# Patient Record
Sex: Male | Born: 1945 | Race: White | Hispanic: No | Marital: Married | Smoking: Never smoker
Health system: Southern US, Community
[De-identification: ages and names within clinical notes are randomized; demographics above are authoritative.]

## PROBLEM LIST (undated history)

## (undated) DIAGNOSIS — M1990 Unspecified osteoarthritis, unspecified site: Secondary | ICD-10-CM

## (undated) DIAGNOSIS — R7303 Prediabetes: Secondary | ICD-10-CM

## (undated) DIAGNOSIS — Z87442 Personal history of urinary calculi: Secondary | ICD-10-CM

## (undated) DIAGNOSIS — K219 Gastro-esophageal reflux disease without esophagitis: Secondary | ICD-10-CM

## (undated) DIAGNOSIS — G4730 Sleep apnea, unspecified: Secondary | ICD-10-CM

## (undated) DIAGNOSIS — I10 Essential (primary) hypertension: Secondary | ICD-10-CM

## (undated) DIAGNOSIS — J40 Bronchitis, not specified as acute or chronic: Secondary | ICD-10-CM

## (undated) DIAGNOSIS — J189 Pneumonia, unspecified organism: Secondary | ICD-10-CM

## (undated) DIAGNOSIS — F32A Depression, unspecified: Secondary | ICD-10-CM

## (undated) DIAGNOSIS — F419 Anxiety disorder, unspecified: Secondary | ICD-10-CM

## (undated) DIAGNOSIS — R0600 Dyspnea, unspecified: Secondary | ICD-10-CM

## (undated) DIAGNOSIS — I739 Peripheral vascular disease, unspecified: Secondary | ICD-10-CM

## (undated) HISTORY — PX: LEG SURGERY: SHX1003

## (undated) HISTORY — PX: HERNIA REPAIR: SHX51

## (undated) HISTORY — PX: TONSILLECTOMY: SUR1361

## (undated) HISTORY — PX: PILONIDAL CYST EXCISION: SHX744

## (undated) HISTORY — PX: EYE SURGERY: SHX253

## (undated) HISTORY — PX: CARDIAC CATHETERIZATION: SHX172

---

## 2019-12-09 ENCOUNTER — Other Ambulatory Visit: Payer: Self-pay | Admitting: Neurosurgery

## 2019-12-09 DIAGNOSIS — M48062 Spinal stenosis, lumbar region with neurogenic claudication: Secondary | ICD-10-CM

## 2019-12-21 ENCOUNTER — Other Ambulatory Visit: Payer: Self-pay

## 2019-12-28 ENCOUNTER — Ambulatory Visit
Admission: RE | Admit: 2019-12-28 | Discharge: 2019-12-28 | Disposition: A | Discharge status: Home / Self Care | Payer: Medicare HMO | Source: Ambulatory Visit | Attending: Neurosurgery | Admitting: Neurosurgery

## 2019-12-28 ENCOUNTER — Other Ambulatory Visit: Payer: Self-pay

## 2019-12-28 DIAGNOSIS — M48062 Spinal stenosis, lumbar region with neurogenic claudication: Secondary | ICD-10-CM

## 2019-12-31 ENCOUNTER — Other Ambulatory Visit: Payer: Self-pay | Admitting: Neurosurgery

## 2020-01-07 NOTE — Pre-Procedure Instructions (Signed)
[facility] - [place] - [id number] [place] [address] Phone: [phone] Fax: [phone]      Your procedure is scheduled on Wednesday, February 24th.  Report to [facility] Main Entrance "A" at 10:50 A.M., and check in at the Admitting office.  Call this number if you have problems the morning of surgery:  [phone]  Call [phone] if you have any questions prior to your surgery date Monday-Friday 8am-4pm    Remember:  Do not eat or drink after midnight the night before your surgery    Take these medicines the morning of surgery with A SIP OF WATER  buPROPion (WELLBUTRIN SR) gabapentin (NEURONTIN) tetrahydrozoline   Follow your surgeon's instructions on when to stop Aspirin.  If no instructions were given by your surgeon then you will need to call the office to get those instructions.    As of today, STOP taking any Aspirin (unless otherwise instructed by your surgeon), Aleve, Naproxen, Ibuprofen, Motrin, Advil, Goody's, BC's, all herbal medications, fish oil, and all vitamins.    The Morning of Surgery  Do not wear jewelry.  Do not wear lotions, powders, or colognes, or deodorant  Men may shave face and neck.  Do not bring valuables to the hospital.  [facility] is not responsible for any belongings or valuables.  If you are a smoker, DO NOT Smoke 24 hours prior to surgery  If you wear a CPAP at night please bring your mask the morning of surgery   Remember that you must have someone to transport you home after your surgery, and remain with you for 24 hours if you are discharged the same day.   Please bring cases for contacts, glasses, hearing aids, dentures or bridgework because it cannot be worn into surgery.    Leave your suitcase in the car.  After surgery it may be brought to your room.  For patients admitted to the hospital, discharge time will be determined by your treatment team.  Patients discharged the  day of surgery will not be allowed to drive home.    Special instructions:   [place]- Preparing For Surgery  Before surgery, you can play an important role. Because skin is not sterile, your skin needs to be as free of germs as possible. You can reduce the number of germs on your skin by washing with CHG (chlorahexidine gluconate) Soap before surgery.  CHG is an antiseptic cleaner which kills germs and bonds with the skin to continue killing germs even after washing.    Oral Hygiene is also important to reduce your risk of infection.  Remember - BRUSH YOUR TEETH THE MORNING OF SURGERY WITH YOUR REGULAR TOOTHPASTE  Please do not use if you have an allergy to CHG or antibacterial soaps. If your skin becomes reddened/irritated stop using the CHG.  Do not shave (including legs and underarms) for at least 48 hours prior to first CHG shower. It is OK to shave your face.  Please follow these instructions carefully.   1. Shower the NIGHT BEFORE SURGERY and the MORNING OF SURGERY with CHG Soap.   2. If you chose to wash your hair, wash your hair first as usual with your normal shampoo.  3. After you shampoo, rinse your hair and body thoroughly to remove the shampoo.  4. Use CHG as you would any other liquid soap. You can apply CHG directly to the skin and wash gently  with a scrungie or a clean washcloth.   5. Apply the CHG Soap to your body ONLY FROM THE NECK DOWN.  Do not use on open wounds or open sores. Avoid contact with your eyes, ears, mouth and genitals (private parts). Wash Face and genitals (private parts)  with your normal soap.   6. Wash thoroughly, paying special attention to the area where your surgery will be performed.  7. Thoroughly rinse your body with warm water from the neck down.  8. DO NOT shower/wash with your normal soap after using and rinsing off the CHG Soap.  9. Pat yourself dry with a CLEAN TOWEL.  10. Wear CLEAN PAJAMAS to bed the night before surgery, wear  comfortable clothes the morning of surgery  11. Place CLEAN SHEETS on your bed the night of your first shower and DO NOT SLEEP WITH PETS.    Day of Surgery:  Please shower the morning of surgery with the CHG soap Do not apply any deodorants/lotions. Please wear clean clothes to the hospital/surgery center.   Remember to brush your teeth WITH YOUR REGULAR TOOTHPASTE.   Please read over the following fact sheets that you were given.

## 2020-01-10 ENCOUNTER — Other Ambulatory Visit (HOSPITAL_COMMUNITY)
Admission: RE | Admit: 2020-01-10 | Discharge: 2020-01-10 | Disposition: A | Discharge status: Home / Self Care | Payer: Medicare HMO | Source: Ambulatory Visit | Attending: Neurosurgery | Admitting: Neurosurgery

## 2020-01-10 ENCOUNTER — Encounter (HOSPITAL_COMMUNITY): Payer: Self-pay

## 2020-01-10 ENCOUNTER — Encounter (HOSPITAL_COMMUNITY)
Admission: RE | Admit: 2020-01-10 | Discharge: 2020-01-10 | Disposition: A | Discharge status: Home / Self Care | Payer: Medicare HMO | Source: Ambulatory Visit | Attending: Neurosurgery | Admitting: Neurosurgery

## 2020-01-10 ENCOUNTER — Other Ambulatory Visit: Payer: Self-pay

## 2020-01-10 HISTORY — DX: Essential (primary) hypertension: I10

## 2020-01-10 HISTORY — DX: Bronchitis, not specified as acute or chronic: J40

## 2020-01-10 HISTORY — DX: Personal history of urinary calculi: Z87.442

## 2020-01-10 HISTORY — DX: Sleep apnea, unspecified: G47.30

## 2020-01-10 HISTORY — DX: Gastro-esophageal reflux disease without esophagitis: K21.9

## 2020-01-10 HISTORY — DX: Anxiety disorder, unspecified: F41.9

## 2020-01-10 LAB — BASIC METABOLIC PANEL
Anion gap: 14 (ref 5–15)
BUN: 15 mg/dL (ref 8–23)
CO2: 24 mmol/L (ref 22–32)
Calcium: 9.4 mg/dL (ref 8.9–10.3)
Chloride: 103 mmol/L (ref 98–111)
Creatinine, Ser: 1.09 mg/dL (ref 0.61–1.24)
GFR calc Af Amer: >60 mL/min (ref >60)
GFR calc non Af Amer: >60 mL/min (ref >60)
Glucose, Bld: 127 mg/dL — ABNORMAL HIGH (ref 70–99)
Potassium: 4.1 mmol/L (ref 3.5–5.1)
Sodium: 141 mmol/L (ref 135–145)

## 2020-01-10 LAB — CBC
HCT: 44.1 % (ref 39.0–52.0)
Hemoglobin: 14.3 g/dL (ref 13.0–17.0)
MCH: 31.2 pg (ref 26.0–34.0)
MCHC: 32.4 g/dL (ref 30.0–36.0)
MCV: 96.3 fL (ref 80.0–100.0)
Platelets: 234 10*3/uL (ref 150–400)
RBC: 4.58 MIL/uL (ref 4.22–5.81)
RDW: 12.7 % (ref 11.5–15.5)
WBC: 6 10*3/uL (ref 4.0–10.5)
nRBC: 0 % (ref 0.0–0.2)

## 2020-01-10 LAB — SURGICAL PCR SCREEN
MRSA, PCR: NEGATIVE
Staphylococcus aureus: NEGATIVE

## 2020-01-10 LAB — SARS CORONAVIRUS 2 (TAT 6-24 HRS): SARS Coronavirus 2: NEGATIVE

## 2020-01-10 NOTE — Progress Notes (Signed)
PCP - Dr. Glean Hess  Cardiologist - Denies  Chest x-ray - Denies  EKG - 01/10/20  Stress Test - 2010 -Neg  ECHO - 2010- Neg  Cardiac Cath - 2010- Neg  AICD-na PM-na LOOP-na  Sleep Study - Yes- Positive CPAP - Yes- will bring machine and equipment dos  LABS- 01/10/20: CBC, BMP, PCR, COVID  ASA- LD- 1 wk ago  ERAS- No  HA1C- Denies  Anesthesia- No  Pt denies having chest pain, sob, or fever at this time. All instructions explained to the pt, with a verbal understanding of the material. Pt agrees to go over the instructions while at home for a better understanding. Pt also instructed to self quarantine after being tested for COVID-19. The opportunity to ask questions was provided.   Coronavirus Screening  Have you experienced the following symptoms:  Cough yes/no: No Fever (>100.26F)  yes/no: No Runny nose yes/no: No Sore throat yes/no: No Difficulty breathing/shortness of breath  yes/no: No  Have you or a family member traveled in the last 14 days and where? yes/no: No   If the patient indicates "YES" to the above questions, their PAT will be rescheduled to limit the exposure to others and, the surgeon will be notified. THE PATIENT WILL NEED TO BE ASYMPTOMATIC FOR 14 DAYS.   If the patient is not experiencing any of these symptoms, the PAT nurse will instruct them to NOT bring anyone with them to their appointment since they may have these symptoms or traveled as well.   Please remind your patients and families that hospital visitation restrictions are in effect and the importance of the restrictions.

## 2020-01-11 MED ORDER — DEXTROSE 5 % IV SOLN
3.0000 g | INTRAVENOUS | Status: AC
  Administered 2020-01-12: 15:00:00 3 g via INTRAVENOUS
  Filled 2020-01-11: qty 3
  Filled 2020-01-11: qty 3000

## 2020-01-12 ENCOUNTER — Inpatient Hospital Stay (HOSPITAL_COMMUNITY): Payer: Medicare HMO | Admitting: Anesthesiology

## 2020-01-12 ENCOUNTER — Inpatient Hospital Stay (HOSPITAL_COMMUNITY): Payer: Medicare HMO

## 2020-01-12 ENCOUNTER — Encounter (HOSPITAL_COMMUNITY)
Admission: RE | Disposition: A | Discharge status: Home / Self Care | Payer: Self-pay | Source: Home / Self Care | Attending: Neurosurgery

## 2020-01-12 ENCOUNTER — Encounter (HOSPITAL_COMMUNITY): Payer: Self-pay | Admitting: Neurosurgery

## 2020-01-12 ENCOUNTER — Other Ambulatory Visit: Payer: Self-pay

## 2020-01-12 ENCOUNTER — Inpatient Hospital Stay (HOSPITAL_COMMUNITY)
Admission: RE | Admit: 2020-01-12 | Discharge: 2020-01-14 | DRG: 520 | Disposition: A | Discharge status: Home / Self Care | Payer: Medicare HMO | Attending: Neurosurgery | Admitting: Neurosurgery

## 2020-01-12 DIAGNOSIS — M48062 Spinal stenosis, lumbar region with neurogenic claudication: Secondary | ICD-10-CM | POA: Diagnosis present

## 2020-01-12 DIAGNOSIS — G4730 Sleep apnea, unspecified: Secondary | ICD-10-CM | POA: Diagnosis present

## 2020-01-12 DIAGNOSIS — I10 Essential (primary) hypertension: Secondary | ICD-10-CM | POA: Diagnosis present

## 2020-01-12 DIAGNOSIS — Z885 Allergy status to narcotic agent status: Secondary | ICD-10-CM

## 2020-01-12 DIAGNOSIS — K219 Gastro-esophageal reflux disease without esophagitis: Secondary | ICD-10-CM | POA: Diagnosis present

## 2020-01-12 DIAGNOSIS — M549 Dorsalgia, unspecified: Secondary | ICD-10-CM | POA: Diagnosis present

## 2020-01-12 DIAGNOSIS — Z6839 Body mass index (BMI) 39.0-39.9, adult: Secondary | ICD-10-CM

## 2020-01-12 DIAGNOSIS — Z20822 Contact with and (suspected) exposure to covid-19: Secondary | ICD-10-CM | POA: Diagnosis present

## 2020-01-12 DIAGNOSIS — M48061 Spinal stenosis, lumbar region without neurogenic claudication: Secondary | ICD-10-CM | POA: Diagnosis present

## 2020-01-12 DIAGNOSIS — Z419 Encounter for procedure for purposes other than remedying health state, unspecified: Secondary | ICD-10-CM

## 2020-01-12 DIAGNOSIS — Z7982 Long term (current) use of aspirin: Secondary | ICD-10-CM | POA: Diagnosis not present

## 2020-01-12 HISTORY — PX: LUMBAR LAMINECTOMY/DECOMPRESSION MICRODISCECTOMY: SHX5026

## 2020-01-12 SURGERY — LUMBAR LAMINECTOMY/DECOMPRESSION MICRODISCECTOMY 3 LEVELS
Anesthesia: General | Site: Spine Lumbar

## 2020-01-12 MED ORDER — LIDOCAINE 2% (20 MG/ML) 5 ML SYRINGE
INTRAMUSCULAR | Status: AC
  Filled 2020-01-12: qty 10

## 2020-01-12 MED ORDER — IBUPROFEN 200 MG PO TABS
400.0000 mg | ORAL_TABLET | Freq: Every day | ORAL | Status: DC
  Administered 2020-01-12 – 2020-01-13 (×2): 400 mg via ORAL
  Filled 2020-01-12 (×2): qty 2

## 2020-01-12 MED ORDER — ACETAMINOPHEN 500 MG PO TABS
1000.0000 mg | ORAL_TABLET | Freq: Once | ORAL | Status: AC
  Administered 2020-01-12: 1000 mg via ORAL
  Filled 2020-01-12: qty 2

## 2020-01-12 MED ORDER — ONDANSETRON HCL 4 MG/2ML IJ SOLN
INTRAMUSCULAR | Status: AC
  Filled 2020-01-12: qty 2

## 2020-01-12 MED ORDER — BUPIVACAINE HCL (PF) 0.25 % IJ SOLN
INTRAMUSCULAR | Status: DC | PRN
  Administered 2020-01-12: 10 mL

## 2020-01-12 MED ORDER — ONDANSETRON HCL 4 MG PO TABS: 4.0000 mg | ORAL_TABLET | Freq: Four times a day (QID) | ORAL | Status: DC | PRN

## 2020-01-12 MED ORDER — ASPIRIN EC 81 MG PO TBEC
81.0000 mg | DELAYED_RELEASE_TABLET | Freq: Every day | ORAL | Status: DC
  Administered 2020-01-13 – 2020-01-14 (×2): 81 mg via ORAL
  Filled 2020-01-12 (×2): qty 1

## 2020-01-12 MED ORDER — ROCURONIUM BROMIDE 10 MG/ML (PF) SYRINGE
PREFILLED_SYRINGE | INTRAVENOUS | Status: DC | PRN
  Administered 2020-01-12 (×2): 50 mg via INTRAVENOUS

## 2020-01-12 MED ORDER — CHLORHEXIDINE GLUCONATE CLOTH 2 % EX PADS: 6.0000 | MEDICATED_PAD | Freq: Once | CUTANEOUS | Status: DC

## 2020-01-12 MED ORDER — PHENYLEPHRINE 40 MCG/ML (10ML) SYRINGE FOR IV PUSH (FOR BLOOD PRESSURE SUPPORT)
PREFILLED_SYRINGE | INTRAVENOUS | Status: AC
  Filled 2020-01-12: qty 20

## 2020-01-12 MED ORDER — TEMAZEPAM 15 MG PO CAPS
15.0000 mg | ORAL_CAPSULE | Freq: Every day | ORAL | Status: DC
  Administered 2020-01-12 – 2020-01-13 (×2): 15 mg via ORAL
  Filled 2020-01-12 (×2): qty 1

## 2020-01-12 MED ORDER — ONDANSETRON HCL 4 MG/2ML IJ SOLN
INTRAMUSCULAR | Status: DC | PRN
  Administered 2020-01-12: 4 mg via INTRAVENOUS

## 2020-01-12 MED ORDER — NAPHAZOLINE-GLYCERIN 0.012-0.2 % OP SOLN
2.0000 [drp] | Freq: Four times a day (QID) | OPHTHALMIC | Status: DC | PRN
  Filled 2020-01-12: qty 15

## 2020-01-12 MED ORDER — B COMPLEX-C PO TABS
1.0000 | ORAL_TABLET | Freq: Every day | ORAL | Status: DC
  Administered 2020-01-13 – 2020-01-14 (×2): 1 via ORAL
  Filled 2020-01-12 (×2): qty 1

## 2020-01-12 MED ORDER — ONDANSETRON HCL 4 MG/2ML IJ SOLN
INTRAMUSCULAR | Status: AC
  Administered 2020-01-12: 4 mg via INTRAVENOUS
  Filled 2020-01-12: qty 2

## 2020-01-12 MED ORDER — ROCURONIUM BROMIDE 10 MG/ML (PF) SYRINGE
PREFILLED_SYRINGE | INTRAVENOUS | Status: AC
  Filled 2020-01-12: qty 10

## 2020-01-12 MED ORDER — FENTANYL CITRATE (PF) 250 MCG/5ML IJ SOLN
INTRAMUSCULAR | Status: AC
  Filled 2020-01-12: qty 5

## 2020-01-12 MED ORDER — SODIUM CHLORIDE 0.9 % IV SOLN: 250.0000 mL | INTRAVENOUS | Status: DC

## 2020-01-12 MED ORDER — SODIUM CHLORIDE 0.9 % IV SOLN: INTRAVENOUS | Status: DC | PRN

## 2020-01-12 MED ORDER — APOAEQUORIN 10 MG PO CAPS: ORAL_CAPSULE | Freq: Every day | ORAL | Status: DC

## 2020-01-12 MED ORDER — BUPROPION HCL ER (SR) 150 MG PO TB12
150.0000 mg | ORAL_TABLET | Freq: Every day | ORAL | Status: DC
  Administered 2020-01-13 – 2020-01-14 (×2): 150 mg via ORAL
  Filled 2020-01-12 (×2): qty 1

## 2020-01-12 MED ORDER — PROPOFOL 10 MG/ML IV BOLUS
INTRAVENOUS | Status: DC | PRN
  Administered 2020-01-12: 140 mg via INTRAVENOUS

## 2020-01-12 MED ORDER — DEXAMETHASONE SODIUM PHOSPHATE 10 MG/ML IJ SOLN
INTRAMUSCULAR | Status: AC
  Filled 2020-01-12: qty 1

## 2020-01-12 MED ORDER — THROMBIN 5000 UNITS EX SOLR: OROMUCOSAL | Status: DC | PRN

## 2020-01-12 MED ORDER — SODIUM CHLORIDE 0.9% FLUSH: 3.0000 mL | INTRAVENOUS | Status: DC | PRN

## 2020-01-12 MED ORDER — ASCORBIC ACID 500 MG PO TABS
500.0000 mg | ORAL_TABLET | Freq: Every day | ORAL | Status: DC
  Administered 2020-01-13 – 2020-01-14 (×2): 500 mg via ORAL
  Filled 2020-01-12 (×2): qty 1

## 2020-01-12 MED ORDER — THROMBIN 5000 UNITS EX SOLR
CUTANEOUS | Status: AC
  Filled 2020-01-12: qty 5000

## 2020-01-12 MED ORDER — FENTANYL CITRATE (PF) 250 MCG/5ML IJ SOLN
INTRAMUSCULAR | Status: DC | PRN
  Administered 2020-01-12 (×2): 50 ug via INTRAVENOUS

## 2020-01-12 MED ORDER — OXYCODONE HCL 5 MG PO TABS
ORAL_TABLET | ORAL | Status: AC
  Filled 2020-01-12: qty 1

## 2020-01-12 MED ORDER — SODIUM CHLORIDE 0.9% FLUSH
3.0000 mL | Freq: Two times a day (BID) | INTRAVENOUS | Status: DC
  Administered 2020-01-12 – 2020-01-14 (×4): 3 mL via INTRAVENOUS

## 2020-01-12 MED ORDER — PHENYLEPHRINE HCL-NACL 10-0.9 MG/250ML-% IV SOLN
INTRAVENOUS | Status: DC | PRN
  Administered 2020-01-12: 50 ug/min via INTRAVENOUS

## 2020-01-12 MED ORDER — EPHEDRINE 5 MG/ML INJ
INTRAVENOUS | Status: AC
  Filled 2020-01-12: qty 10

## 2020-01-12 MED ORDER — ACETAMINOPHEN 650 MG RE SUPP: 650.0000 mg | RECTAL | Status: DC | PRN

## 2020-01-12 MED ORDER — HYDROMORPHONE HCL 1 MG/ML IJ SOLN
0.5000 mg | INTRAMUSCULAR | Status: DC | PRN
  Administered 2020-01-12: 0.5 mg via INTRAVENOUS
  Filled 2020-01-12: qty 1

## 2020-01-12 MED ORDER — LOSARTAN POTASSIUM 50 MG PO TABS
100.0000 mg | ORAL_TABLET | Freq: Every day | ORAL | Status: DC
  Administered 2020-01-13 – 2020-01-14 (×2): 100 mg via ORAL
  Filled 2020-01-12 (×2): qty 2

## 2020-01-12 MED ORDER — LIDOCAINE-EPINEPHRINE 1 %-1:100000 IJ SOLN
INTRAMUSCULAR | Status: DC | PRN
  Administered 2020-01-12: 10 mL

## 2020-01-12 MED ORDER — CEFAZOLIN SODIUM-DEXTROSE 2-4 GM/100ML-% IV SOLN
2.0000 g | Freq: Three times a day (TID) | INTRAVENOUS | Status: DC
  Administered 2020-01-12 – 2020-01-13 (×2): 2 g via INTRAVENOUS
  Filled 2020-01-12 (×3): qty 100

## 2020-01-12 MED ORDER — ONDANSETRON HCL 4 MG/2ML IJ SOLN: 4.0000 mg | Freq: Once | INTRAMUSCULAR | Status: AC | PRN

## 2020-01-12 MED ORDER — FENTANYL CITRATE (PF) 100 MCG/2ML IJ SOLN
25.0000 ug | INTRAMUSCULAR | Status: DC | PRN
  Administered 2020-01-12 (×2): 50 ug via INTRAVENOUS

## 2020-01-12 MED ORDER — MENTHOL 3 MG MT LOZG: 1.0000 | LOZENGE | OROMUCOSAL | Status: DC | PRN

## 2020-01-12 MED ORDER — ONDANSETRON HCL 4 MG/2ML IJ SOLN: 4.0000 mg | Freq: Four times a day (QID) | INTRAMUSCULAR | Status: DC | PRN

## 2020-01-12 MED ORDER — PHENOL 1.4 % MT LIQD: 1.0000 | OROMUCOSAL | Status: DC | PRN

## 2020-01-12 MED ORDER — SUGAMMADEX SODIUM 500 MG/5ML IV SOLN
INTRAVENOUS | Status: AC
  Filled 2020-01-12: qty 5

## 2020-01-12 MED ORDER — LIDOCAINE 2% (20 MG/ML) 5 ML SYRINGE
INTRAMUSCULAR | Status: AC
  Filled 2020-01-12: qty 5

## 2020-01-12 MED ORDER — THROMBIN 20000 UNITS EX SOLR: CUTANEOUS | Status: DC | PRN

## 2020-01-12 MED ORDER — B COMPLEX PO TABS: 1.0000 | ORAL_TABLET | Freq: Every day | ORAL | Status: DC

## 2020-01-12 MED ORDER — ALUM & MAG HYDROXIDE-SIMETH 200-200-20 MG/5ML PO SUSP: 30.0000 mL | Freq: Four times a day (QID) | ORAL | Status: DC | PRN

## 2020-01-12 MED ORDER — LIDOCAINE-EPINEPHRINE 1 %-1:100000 IJ SOLN
INTRAMUSCULAR | Status: AC
  Filled 2020-01-12: qty 1

## 2020-01-12 MED ORDER — CYCLOBENZAPRINE HCL 10 MG PO TABS
10.0000 mg | ORAL_TABLET | Freq: Three times a day (TID) | ORAL | Status: DC | PRN
  Administered 2020-01-12 – 2020-01-13 (×3): 10 mg via ORAL
  Filled 2020-01-12 (×2): qty 1

## 2020-01-12 MED ORDER — CYCLOBENZAPRINE HCL 10 MG PO TABS
ORAL_TABLET | ORAL | Status: AC
  Filled 2020-01-12: qty 1

## 2020-01-12 MED ORDER — 0.9 % SODIUM CHLORIDE (POUR BTL) OPTIME
TOPICAL | Status: DC | PRN
  Administered 2020-01-12: 1000 mL

## 2020-01-12 MED ORDER — LACTATED RINGERS IV SOLN: INTRAVENOUS | Status: DC

## 2020-01-12 MED ORDER — PANTOPRAZOLE SODIUM 40 MG IV SOLR
40.0000 mg | Freq: Every day | INTRAVENOUS | Status: DC
  Administered 2020-01-12: 21:00:00 40 mg via INTRAVENOUS
  Filled 2020-01-12: qty 40

## 2020-01-12 MED ORDER — DEXAMETHASONE SODIUM PHOSPHATE 10 MG/ML IJ SOLN
10.0000 mg | Freq: Once | INTRAMUSCULAR | Status: AC
  Administered 2020-01-12: 10 mg via INTRAVENOUS

## 2020-01-12 MED ORDER — ROCURONIUM BROMIDE 10 MG/ML (PF) SYRINGE
PREFILLED_SYRINGE | INTRAVENOUS | Status: AC
  Filled 2020-01-12: qty 30

## 2020-01-12 MED ORDER — OXYCODONE HCL 5 MG PO TABS
10.0000 mg | ORAL_TABLET | ORAL | Status: DC | PRN
  Administered 2020-01-12 – 2020-01-13 (×2): 10 mg via ORAL
  Filled 2020-01-12: qty 2

## 2020-01-12 MED ORDER — PROPOFOL 10 MG/ML IV BOLUS
INTRAVENOUS | Status: AC
  Filled 2020-01-12: qty 20

## 2020-01-12 MED ORDER — FENTANYL CITRATE (PF) 100 MCG/2ML IJ SOLN
INTRAMUSCULAR | Status: AC
  Filled 2020-01-12: qty 2

## 2020-01-12 MED ORDER — BUPIVACAINE HCL (PF) 0.25 % IJ SOLN
INTRAMUSCULAR | Status: AC
  Filled 2020-01-12: qty 30

## 2020-01-12 MED ORDER — ACETAMINOPHEN 325 MG PO TABS
650.0000 mg | ORAL_TABLET | ORAL | Status: DC | PRN
  Administered 2020-01-13: 650 mg via ORAL
  Filled 2020-01-12: qty 2

## 2020-01-12 MED ORDER — LIDOCAINE 2% (20 MG/ML) 5 ML SYRINGE
INTRAMUSCULAR | Status: DC | PRN
  Administered 2020-01-12: 60 mg via INTRAVENOUS

## 2020-01-12 MED ORDER — THROMBIN 20000 UNITS EX SOLR
CUTANEOUS | Status: AC
  Filled 2020-01-12: qty 20000

## 2020-01-12 MED ORDER — VITAMIN D 25 MCG (1000 UNIT) PO TABS
1000.0000 [IU] | ORAL_TABLET | Freq: Every day | ORAL | Status: DC
  Administered 2020-01-13 – 2020-01-14 (×2): 1000 [IU] via ORAL
  Filled 2020-01-12 (×2): qty 1

## 2020-01-12 MED ORDER — ARTIFICIAL TEARS OPHTHALMIC OINT
TOPICAL_OINTMENT | OPHTHALMIC | Status: AC
  Filled 2020-01-12: qty 3.5

## 2020-01-12 MED ORDER — SUGAMMADEX SODIUM 200 MG/2ML IV SOLN
INTRAVENOUS | Status: DC | PRN
  Administered 2020-01-12: 300 mg via INTRAVENOUS

## 2020-01-12 MED ORDER — GABAPENTIN 300 MG PO CAPS
300.0000 mg | ORAL_CAPSULE | Freq: Two times a day (BID) | ORAL | Status: DC
  Administered 2020-01-12 – 2020-01-14 (×4): 300 mg via ORAL
  Filled 2020-01-12 (×4): qty 1

## 2020-01-12 SURGICAL SUPPLY — 56 items
BAG DECANTER FOR FLEXI CONT (MISCELLANEOUS) ×2 IMPLANT
BENZOIN TINCTURE PRP APPL 2/3 (GAUZE/BANDAGES/DRESSINGS) ×2 IMPLANT
BLADE CLIPPER SURG (BLADE) IMPLANT
BLADE SURG 11 STRL SS (BLADE) ×2 IMPLANT
BUR CUTTER 7.0 ROUND (BURR) ×2 IMPLANT
BUR MATCHSTICK NEURO 3.0 LAGG (BURR) ×2 IMPLANT
CANISTER SUCT 3000ML PPV (MISCELLANEOUS) ×2 IMPLANT
CARTRIDGE OIL MAESTRO DRILL (MISCELLANEOUS) ×1 IMPLANT
COVER WAND RF STERILE (DRAPES) ×2 IMPLANT
DERMABOND ADVANCED (GAUZE/BANDAGES/DRESSINGS) ×1
DERMABOND ADVANCED .7 DNX12 (GAUZE/BANDAGES/DRESSINGS) ×1 IMPLANT
DIFFUSER DRILL AIR PNEUMATIC (MISCELLANEOUS) ×2 IMPLANT
DRAPE HALF SHEET 40X57 (DRAPES) IMPLANT
DRAPE LAPAROTOMY 100X72X124 (DRAPES) ×2 IMPLANT
DRAPE MICROSCOPE LEICA (MISCELLANEOUS) ×2 IMPLANT
DRAPE SURG 17X23 STRL (DRAPES) ×2 IMPLANT
DRSG OPSITE POSTOP 4X6 (GAUZE/BANDAGES/DRESSINGS) ×1 IMPLANT
DURAPREP 26ML APPLICATOR (WOUND CARE) ×2 IMPLANT
ELECT REM PT RETURN 9FT ADLT (ELECTROSURGICAL) ×2
ELECTRODE REM PT RTRN 9FT ADLT (ELECTROSURGICAL) ×1 IMPLANT
EVACUATOR 1/8 PVC DRAIN (DRAIN) ×1 IMPLANT
GAUZE 4X4 16PLY RFD (DISPOSABLE) IMPLANT
GAUZE SPONGE 4X4 12PLY STRL (GAUZE/BANDAGES/DRESSINGS) ×2 IMPLANT
GLOVE BIO SURGEON STRL SZ7 (GLOVE) IMPLANT
GLOVE BIO SURGEON STRL SZ8 (GLOVE) ×2 IMPLANT
GLOVE BIOGEL PI IND STRL 7.0 (GLOVE) IMPLANT
GLOVE BIOGEL PI IND STRL 7.5 (GLOVE) IMPLANT
GLOVE BIOGEL PI INDICATOR 7.0 (GLOVE) ×5
GLOVE BIOGEL PI INDICATOR 7.5 (GLOVE) ×2
GLOVE ECLIPSE 7.5 STRL STRAW (GLOVE) ×1 IMPLANT
GLOVE INDICATOR 8.5 STRL (GLOVE) ×2 IMPLANT
GLOVE SURG SS PI 6.5 STRL IVOR (GLOVE) ×3 IMPLANT
GLOVE SURG SS PI 7.0 STRL IVOR (GLOVE) ×3 IMPLANT
GOWN STRL REUS W/ TWL LRG LVL3 (GOWN DISPOSABLE) ×1 IMPLANT
GOWN STRL REUS W/ TWL XL LVL3 (GOWN DISPOSABLE) ×2 IMPLANT
GOWN STRL REUS W/TWL 2XL LVL3 (GOWN DISPOSABLE) IMPLANT
GOWN STRL REUS W/TWL LRG LVL3 (GOWN DISPOSABLE) ×2
GOWN STRL REUS W/TWL XL LVL3 (GOWN DISPOSABLE) ×2
HEMOSTAT POWDER KIT SURGIFOAM (HEMOSTASIS) ×1 IMPLANT
KIT BASIN OR (CUSTOM PROCEDURE TRAY) ×2 IMPLANT
KIT TURNOVER KIT B (KITS) ×2 IMPLANT
NDL SPNL 22GX3.5 QUINCKE BK (NEEDLE) ×1 IMPLANT
NEEDLE HYPO 22GX1.5 SAFETY (NEEDLE) ×2 IMPLANT
NEEDLE SPNL 22GX3.5 QUINCKE BK (NEEDLE) ×2 IMPLANT
NS IRRIG 1000ML POUR BTL (IV SOLUTION) ×2 IMPLANT
OIL CARTRIDGE MAESTRO DRILL (MISCELLANEOUS) ×2
PACK LAMINECTOMY NEURO (CUSTOM PROCEDURE TRAY) ×2 IMPLANT
SPONGE SURGIFOAM ABS GEL 100 (HEMOSTASIS) ×1 IMPLANT
STRIP CLOSURE SKIN 1/2X4 (GAUZE/BANDAGES/DRESSINGS) ×2 IMPLANT
SUT VIC AB 0 CT1 18XCR BRD8 (SUTURE) ×1 IMPLANT
SUT VIC AB 0 CT1 8-18 (SUTURE) ×1
SUT VIC AB 2-0 CT1 18 (SUTURE) ×2 IMPLANT
SUT VIC AB 4-0 PS2 27 (SUTURE) ×2 IMPLANT
TOWEL GREEN STERILE (TOWEL DISPOSABLE) ×2 IMPLANT
TOWEL GREEN STERILE FF (TOWEL DISPOSABLE) ×2 IMPLANT
WATER STERILE IRR 1000ML POUR (IV SOLUTION) ×2 IMPLANT

## 2020-01-12 NOTE — Anesthesia Preprocedure Evaluation (Addendum)
Anesthesia Evaluation  Patient identified by MRN, date of birth, ID band Patient awake    Reviewed: Allergy & Precautions, NPO status , Patient's Chart, lab work & pertinent test results  History of Anesthesia Complications Negative for: history of anesthetic complications  Airway Mallampati: II  TM Distance: >3 FB Neck ROM: Full    Dental  (+) Teeth Intact, Dental Advisory Given, Implants   Pulmonary sleep apnea and Continuous Positive Airway Pressure Ventilation ,    Pulmonary exam normal breath sounds clear to auscultation       Cardiovascular hypertension, Pt. on medications Normal cardiovascular exam Rhythm:Regular Rate:Normal     Neuro/Psych PSYCHIATRIC DISORDERS Anxiety Lumbar stenosis     GI/Hepatic Neg liver ROS, GERD  ,  Endo/Other  Morbid obesity  Renal/GU negative Renal ROS     Musculoskeletal negative musculoskeletal ROS (+)   Abdominal   Peds  Hematology negative hematology ROS (+)   Anesthesia Other Findings Day of surgery medications reviewed with the patient.  Reproductive/Obstetrics                            Anesthesia Physical Anesthesia Plan  ASA: III  Anesthesia Plan: General   Post-op Pain Management:    Induction: Intravenous  PONV Risk Score and Plan: 3 and Dexamethasone, Ondansetron and Treatment may vary due to age or medical condition  Airway Management Planned: Oral ETT  Additional Equipment:   Intra-op Plan:   Post-operative Plan: Extubation in OR  Informed Consent: I have reviewed the patients History and Physical, chart, labs and discussed the procedure including the risks, benefits and alternatives for the proposed anesthesia with the patient or authorized representative who has indicated his/her understanding and acceptance.     Dental advisory given  Plan Discussed with: CRNA  Anesthesia Plan Comments:         Anesthesia Quick  Evaluation

## 2020-01-12 NOTE — Transfer of Care (Signed)
Immediate Anesthesia Transfer of Care Note  Patient: Nathan Moran  Procedure(s) Performed: Bilateral Lumbar One-Two, Lumbar Two-Three, Lumbar Three-Four Laminectomy and Foraminotomy (N/A Spine Lumbar)  Patient Location: PACU  Anesthesia Type:General  Level of Consciousness: awake, alert  and oriented  Airway & Oxygen Therapy: Patient Spontanous Breathing  Post-op Assessment: Report given to RN and Post -op Vital signs reviewed and stable  Post vital signs: Reviewed and stable  Last Vitals:  Vitals Value Taken Time  BP 146/77 01/12/20 1709  Temp    Pulse 73 01/12/20 1713  Resp 15 01/12/20 1713  SpO2 98 % 01/12/20 1713  Vitals shown include unvalidated device data.  Last Pain:  Vitals:   01/12/20 1043  TempSrc:   PainSc: 0-No pain         Complications: No apparent anesthesia complications

## 2020-01-12 NOTE — Anesthesia Procedure Notes (Signed)
Procedure Name: Intubation Date/Time: 01/12/2020 2:48 PM Performed by: Valda Favia, CRNA Pre-anesthesia Checklist: Patient identified, Emergency Drugs available, Suction available and Patient being monitored Patient Re-evaluated:Patient Re-evaluated prior to induction Oxygen Delivery Method: Circle System Utilized Preoxygenation: Pre-oxygenation with 100% oxygen Induction Type: IV induction Ventilation: Mask ventilation without difficulty Laryngoscope Size: Mac and 4 Grade View: Grade II Tube type: Oral Tube size: 7.5 mm Number of attempts: 1 Airway Equipment and Method: Stylet and Oral airway Placement Confirmation: ETT inserted through vocal cords under direct vision,  positive ETCO2 and breath sounds checked- equal and bilateral Secured at: 22 cm Tube secured with: Tape Dental Injury: Teeth and Oropharynx as per pre-operative assessment

## 2020-01-12 NOTE — Progress Notes (Signed)
PHARMACIST - PHYSICIAN ORDER COMMUNICATION  CONCERNING: P&T Medication Policy on Herbal Medications  DESCRIPTION:  This patient's order for:  Apoequorin (Prevagen) has been noted.  This product(s) is classified as an "herbal" or natural product. Due to a lack of definitive safety studies or FDA approval, nonstandard manufacturing practices, plus the potential risk of unknown drug-drug interactions while on inpatient medications, the Pharmacy and Therapeutics Committee does not permit the use of "herbal" or natural products of this type within [facility].   ACTION TAKEN: The pharmacy department is unable to verify this order at this time and your patient has been informed of this safety policy. Please reevaluate patient's clinical condition at discharge and address if the herbal or natural product(s) should be resumed at that time.  Vicki Mallet, PharmD, BCPS, [facility] Clinical Pharmacist 01/12/20,19:02 PM

## 2020-01-12 NOTE — Op Note (Signed)
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  Bilateral foraminotomies at L2, L3, and L4 and bilateral medial facetectomies  Surgeon: Jillyn Hidden Gregor Dershem  Assistant: Julien Girt  Anesthesia: General  EBL: Minimal  HPI: Patient is a very pleasant 74 year old gentleman is a longstanding back pain bilateral leg pain neurogenic claudication work-up revealed progressive spinal stenosis primarily centered around L2-3 and L3-4 and a little bit on the inferior aspect of the L1-2 interspace.  Due to patient progression of clinical syndrome imaging findings and failed conservative treatment I recommended decompressive laminectomies at L1-2, L2-3, and L3-4.  I extensively went over the risks and benefits of that procedure with him as well as perioperative course expectations of outcome and alternatives to surgery and he understood and agreed to proceed forward.  Operative procedure: Patient was brought into the OR was due to general anesthesia positioned prone on the Wilson frame his back was prepped and draped in routine sterile fashion after infiltration of 10 cc lidocaine with epi a midline incision was made and Bovie elect cautery was used to take down the subcutaneous tissue and subperiosteal dissection was carried lamina of L1, L2, L3, and L4 bilaterally.  Intraoperative x-ray confirmed identification of the 2 3 interspace so then I remove the entire spinous process of L2 and L3 part of the inferior aspect of L1 and superior aspect of L4.  Utilizing high-speed drill I drilled down the lamina complex and thinned it out starting first at L34 laminotomy was begun with a 3 and 4 Miller Kerrison punch marching superiorly then before I completed that I went up  to 3 identified normal dura at the L1-2 disc base and removed some spur coming off the medial facet complex there and then marching inferiorly there was very large hypertrophic facets causing severe hourglass compression of thecal sac primarily just above L2-3 as well as L3-4.  These were all teased off the dura with 4 Penfield and under biting the medial facet complex with a 3 Miller Kerrison punch I marched down the gutters on both sides.  Performed foraminotomies of the L2, L3, and L4 nerve roots and at the end the decompression and foraminotomies I easily passed a hockey stick out each foramina palpated each pedicle to confirm adequate decompression.  The wounds and copiously irrigated meticulous hemostasis was maintained medium Hemovac drain was placed and the wound was closed in layers with inverted Vicryl skin was closed running 4 subcuticular Dermabond benzoin Steri-Strips and a sterile dressing was applied patient recovery room in stable condition.  At the end the case all needle counts and sponge counts were correct.

## 2020-01-12 NOTE — H&P (Signed)
Nathan Moran is an 74 y.o. male.   Chief Complaint: Back pain bilateral hip and leg pain neurogenic claudication HPI: 74 year old woman longstanding back bilateral hip and leg pain with neurogenic claudication for very short distances.  Work-up revealed severe spinal stenosis with severe foraminal stenosis at L1-L2, L2-3, L3-4.  Due to patient progression of clinical syndrome imaging findings and failed conservative treatment I recommended decompressive laminectomies from L1-L4.  I extensively gone over the risks and benefits of that operation with him as well as perioperative course expectations of outcome and alternatives of surgery he understands and agrees to proceed forward.  Past Medical History:  Diagnosis Date  . Anxiety   . Bronchitis   . GERD (gastroesophageal reflux disease)   . History of kidney stones   . Hypertension   . Sleep apnea    uses CPAP    Past Surgical History:  Procedure Laterality Date  . CARDIAC CATHETERIZATION     2010  . EYE SURGERY     Bialteral cataracts  . LEG SURGERY     Right  . PILONIDAL CYST EXCISION    . TONSILLECTOMY      History reviewed. No pertinent family history. Social History:  reports that he has never smoked. He has never used smokeless tobacco. He reports current alcohol use. He reports that he does not use drugs.  Allergies:  Allergies  Allergen Reactions  . Codeine     Pt experienced numbness after taking codeine    Medications Prior to Admission  Medication Sig Dispense Refill  . Apoaequorin (PREVAGEN PO) Take 1 tablet by mouth daily.    Marland Kitchen ascorbic acid (VITAMIN C) 500 MG tablet Take 500 mg by mouth daily.    Marland Kitchen aspirin 81 MG EC tablet Take 81 mg by mouth daily.    Marland Kitchen b complex vitamins tablet Take 1 tablet by mouth daily.    Marland Kitchen buPROPion (WELLBUTRIN SR) 150 MG 12 hr tablet Take 150 mg by mouth daily.    . cholecalciferol (VITAMIN D3) 25 MCG (1000 UNIT) tablet Take 1,000 Units by mouth daily.    Marland Kitchen gabapentin  (NEURONTIN) 300 MG capsule Take 300 mg by mouth 2 (two) times daily.    Marland Kitchen ibuprofen (ADVIL) 200 MG tablet Take 400 mg by mouth at bedtime.    Marland Kitchen losartan (COZAAR) 100 MG tablet Take 100 mg by mouth daily.    . temazepam (RESTORIL) 15 MG capsule Take 15 mg by mouth at bedtime.    Marland Kitchen tetrahydrozoline 0.05 % ophthalmic solution Place 1 drop into both eyes daily with breakfast.      No results found for this or any previous visit (from the past 48 hour(s)). No results found.  Review of Systems  Musculoskeletal: Positive for back pain.  Neurological: Positive for numbness.    Blood pressure (!) 163/68, pulse 75, temperature 98.4 F (36.9 C), temperature source Oral, resp. rate 18, height 5\' 9"  (1.753 m), weight 122.5 kg, SpO2 99 %. Physical Exam  Constitutional: He is oriented to person, place, and time. He appears well-developed.  HENT:  Head: Normocephalic.  Eyes: Pupils are equal, round, and reactive to light.  Cardiovascular: Normal rate.  Respiratory: Effort normal.  GI: Soft.  Musculoskeletal:        General: Normal range of motion.     Cervical back: Normal range of motion.  Neurological: He is alert and oriented to person, place, and time.  Skin: Skin is warm.     Assessment/Plan 74 year old gentleman presents  for decompressive laminectomy L1-L4.  Lyrick Worland P, MD 01/12/2020, 2:26 PM

## 2020-01-13 ENCOUNTER — Encounter: Payer: Self-pay | Admitting: *Deleted

## 2020-01-13 MED ORDER — DEXAMETHASONE SODIUM PHOSPHATE 10 MG/ML IJ SOLN
10.0000 mg | Freq: Once | INTRAMUSCULAR | Status: AC
  Administered 2020-01-13: 10 mg via INTRAVENOUS
  Filled 2020-01-13: qty 1

## 2020-01-13 MED ORDER — DEXAMETHASONE SODIUM PHOSPHATE 10 MG/ML IJ SOLN
10.0000 mg | Freq: Four times a day (QID) | INTRAMUSCULAR | Status: AC
  Administered 2020-01-13 – 2020-01-14 (×4): 10 mg via INTRAVENOUS
  Filled 2020-01-13 (×4): qty 1

## 2020-01-13 MED ORDER — DIPHENHYDRAMINE HCL 50 MG/ML IJ SOLN
25.0000 mg | Freq: Once | INTRAMUSCULAR | Status: AC
  Administered 2020-01-13: 25 mg via INTRAVENOUS

## 2020-01-13 MED ORDER — HYDROMORPHONE HCL 2 MG PO TABS
2.0000 mg | ORAL_TABLET | ORAL | Status: DC | PRN
  Administered 2020-01-13 (×3): 2 mg via ORAL
  Filled 2020-01-13 (×3): qty 1

## 2020-01-13 MED ORDER — DIPHENHYDRAMINE HCL 50 MG/ML IJ SOLN
25.0000 mg | Freq: Four times a day (QID) | INTRAMUSCULAR | Status: DC | PRN
  Administered 2020-01-13: 25 mg via INTRAVENOUS
  Filled 2020-01-13: qty 1

## 2020-01-13 MED ORDER — DIPHENHYDRAMINE HCL 50 MG/ML IJ SOLN
INTRAMUSCULAR | Status: AC
  Filled 2020-01-13: qty 1

## 2020-01-13 MED ORDER — PANTOPRAZOLE SODIUM 40 MG PO TBEC
40.0000 mg | DELAYED_RELEASE_TABLET | Freq: Every day | ORAL | Status: DC
  Administered 2020-01-13: 40 mg via ORAL
  Filled 2020-01-13: qty 1

## 2020-01-13 NOTE — Progress Notes (Signed)
Subjective: Patient reports Overall patient doing very well no leg pain condition of back soreness responsive to pain medication this morning he is feeling a little difficulty swallowing and some itching around the scalp and his perineum  Objective: Vital signs in last 24 hours: Temp:  [97.8 F (36.6 C)-98.4 F (36.9 C)] 97.9 F (36.6 C) (02/25 0750) Pulse Rate:  [60-82] 82 (02/25 0750) Resp:  [14-19] 18 (02/25 0750) BP: (102-163)/(42-84) 133/62 (02/25 0750) SpO2:  [94 %-100 %] 100 % (02/25 0750) Weight:  [122.5 kg] 122.5 kg (02/24 1035)  Intake/Output from previous day: 02/24 0701 - 02/25 0700 In: 1890 [P.O.:240; I.V.:1500; IV Piggyback:150] Out: 845 [Urine:450; Drains:195; Blood:200] Intake/Output this shift: No intake/output data recorded.  Strength out of 5 wound clean dry and intact  Lab Results: Recent Labs    01/10/20 1036  WBC 6.0  HGB 14.3  HCT 44.1  PLT 234   BMET Recent Labs    01/10/20 1036  NA 141  K 4.1  CL 103  CO2 24  GLUCOSE 127*  BUN 15  CREATININE 1.09  CALCIUM 9.4    Studies/Results: DG Lumbar Spine 1 View  Result Date: 01/12/2020 CLINICAL DATA:  Back pain. EXAM: LUMBAR SPINE - 1 VIEW COMPARISON:  None. FINDINGS: Instrumentation is noted at the presumed L3-L4 and L2-L3 levels. Multilevel degenerative changes are noted throughout the visualized portions of the thoracolumbar spine. IMPRESSION: Instrumentation as above. Electronically Signed   By: Katherine Mantle M.D.   On: 01/12/2020 19:45    Assessment/Plan: Postop day 1 decompressive laminectomy doing very well feels like maybe be having an early allergic reaction to 100 medications unsure what he is gotten in the last couple hours but will give him Decadron and Benadryl DC his antibiotics  LOS: 1 day     Revanth Neidig P 01/13/2020, 7:53 AM

## 2020-01-13 NOTE — Evaluation (Signed)
Physical Therapy Evaluation Patient Details Name: Nathan Moran MRN: [id number] DOB: 1946-07-12 Today's Date: 01/13/2020   History of Present Illness  Pt is a 74 y/o male s/p L1-4 laminectomy. PMH includes HTN and sleep apnea.   Clinical Impression  Patient is s/p above surgery resulting in the deficits listed below (see PT Problem List). Pt requiring min to min guard A for mobility tasks using RW. Educated about using RW at home to improve safety with mobility. Educated about back precautions and generalized walking program. Patient will benefit from skilled PT to increase their independence and safety with mobility (while adhering to their precautions) to allow discharge to the venue listed below.      Follow Up Recommendations Home health PT    Equipment Recommendations  None recommended by PT    Recommendations for Other Services       Precautions / Restrictions Precautions Precautions: Fall;Back Precaution Booklet Issued: No Precaution Comments: Reviewed back precautions with pt.  Restrictions Weight Bearing Restrictions: No      Mobility  Bed Mobility Overal bed mobility: Needs Assistance Bed Mobility: Rolling;Sidelying to Sit Rolling: Min guard Sidelying to sit: Min guard       General bed mobility comments: Min guard to ensure log roll technique. Cues for sequencing.   Transfers Overall transfer level: Needs assistance Equipment used: Rolling walker (2 wheeled) Transfers: Sit to/from Stand Sit to Stand: Min assist         General transfer comment: Min A for lift assist and steadying. Cues for safe hand placement.   Ambulation/Gait Ambulation/Gait assistance: Min guard Gait Distance (Feet): 20 Feet Assistive device: Rolling walker (2 wheeled) Gait Pattern/deviations: Step-through pattern;Decreased stride length Gait velocity: Decreased   General Gait Details: Mild unsteadiness noted, requiring min guard A for steadying. Heavy reliance on UEs  during mobility. Educated about generalized walking program to perform at home.   Stairs            Wheelchair Mobility    Modified Rankin (Stroke Patients Only)       Balance Overall balance assessment: Needs assistance Sitting-balance support: No upper extremity supported;Feet supported Sitting balance-Leahy Scale: Good     Standing balance support: Bilateral upper extremity supported;During functional activity Standing balance-Leahy Scale: Poor Standing balance comment: Reliant on BUE support                             Pertinent Vitals/Pain Pain Assessment: Faces Faces Pain Scale: Hurts even more Pain Location: back Pain Descriptors / Indicators: Aching;Operative site guarding Pain Intervention(s): Limited activity within patient's tolerance;Monitored during session;Repositioned    Home Living Family/patient expects to be discharged to:: Private residence Living Arrangements: Spouse/significant other;Children Available Help at Discharge: Family Type of Home: House Home Access: Ramped entrance     Home Layout: One level Home Equipment: Environmental consultant - 2 wheels      Prior Function Level of Independence: Independent         Comments: Was independent, but reports multiple falls.      Hand Dominance        Extremity/Trunk Assessment   Upper Extremity Assessment Upper Extremity Assessment: Defer to OT evaluation    Lower Extremity Assessment Lower Extremity Assessment: Generalized weakness    Cervical / Trunk Assessment Cervical / Trunk Assessment: Other exceptions Cervical / Trunk Exceptions: s/p lumbar surgery   Communication   Communication: No difficulties  Cognition Arousal/Alertness: Awake/alert Behavior During Therapy: WFL for tasks assessed/performed Overall  Cognitive Status: Within Functional Limits for tasks assessed                                        General Comments      Exercises      Assessment/Plan    PT Assessment Patient needs continued PT services  PT Problem List Decreased strength;Decreased balance;Decreased mobility;Decreased knowledge of use of DME;Decreased knowledge of precautions;Pain       PT Treatment Interventions Gait training;DME instruction;Functional mobility training;Therapeutic exercise;Therapeutic activities;Balance training;Patient/family education    PT Goals (Current goals can be found in the Care Plan section)  Acute Rehab PT Goals Patient Stated Goal: to go home PT Goal Formulation: With patient Time For Goal Achievement: 01/27/20 Potential to Achieve Goals: Good    Frequency Min 5X/week   Barriers to discharge        Co-evaluation               AM-PAC PT "6 Clicks" Mobility  Outcome Measure Help needed turning from your back to your side while in a flat bed without using bedrails?: A Little Help needed moving from lying on your back to sitting on the side of a flat bed without using bedrails?: A Little Help needed moving to and from a bed to a chair (including a wheelchair)?: A Little Help needed standing up from a chair using your arms (e.g., wheelchair or bedside chair)?: A Little Help needed to walk in hospital room?: A Little Help needed climbing 3-5 steps with a railing? : A Lot 6 Click Score: 17    End of Session Equipment Utilized During Treatment: Gait belt Activity Tolerance: Patient tolerated treatment well Patient left: in chair;with call bell/phone within reach Nurse Communication: Mobility status PT Visit Diagnosis: Unsteadiness on feet (R26.81);Muscle weakness (generalized) (M62.81);Pain Pain - part of body: (back)    Time: 0388-8280 PT Time Calculation (min) (ACUTE ONLY): 27 min   Charges:   PT Evaluation $PT Eval Low Complexity: 1 Low PT Treatments $Gait Training: 8-22 mins        Cindee Salt, DPT  Acute Rehabilitation Services  Pager: [phone] Office: [phone]   Lehman Prom 01/13/2020, 10:48 AM

## 2020-01-13 NOTE — Anesthesia Postprocedure Evaluation (Signed)
Anesthesia Post Note  Patient: Nathan Moran  Procedure(s) Performed: Bilateral Lumbar One-Two, Lumbar Two-Three, Lumbar Three-Four Laminectomy and Foraminotomy (N/A Spine Lumbar)     Patient location during evaluation: PACU Anesthesia Type: General Level of consciousness: awake and alert Pain management: pain level controlled Vital Signs Assessment: post-procedure vital signs reviewed and stable Respiratory status: spontaneous breathing, nonlabored ventilation, respiratory function stable and patient connected to nasal cannula oxygen Cardiovascular status: blood pressure returned to baseline and stable Postop Assessment: no apparent nausea or vomiting Anesthetic complications: no    Last Vitals:  Vitals:   01/13/20 0750 01/13/20 1130  BP: 133/62 (!) 139/59  Pulse: 82 90  Resp: 18 18  Temp: 36.6 C 36.7 C  SpO2: 100% 99%    Last Pain:  Vitals:   01/13/20 0750  TempSrc: Oral  PainSc:                  Kaelie Henigan DAVID

## 2020-01-14 MED ORDER — METHOCARBAMOL 500 MG PO TABS: 500.0000 mg | ORAL_TABLET | Freq: Four times a day (QID) | ORAL | 0 refills | Status: DC

## 2020-01-14 NOTE — Discharge Summary (Signed)
Physician Discharge Summary  Patient ID: Nathan Moran MRN: [id number] DOB/AGE: Apr 18, 1946 74 y.o.  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 The patient tolerated the procedure well and was taken to the recovery room and then to the floor in stable condition. The hospital course was routine. There were no complications. The wound remained clean dry and intact. Pt had appropriate back soreness. No complaints of leg pain or new N/T/W. The patient remained afebrile with stable vital signs, and tolerated a regular diet. The patient continued to increase activities, and pain was well controlled with oral pain medications.   Consults: None  Significant Diagnostic Studies:  Results for orders placed or performed during the hospital encounter of 01/10/20  SARS CORONAVIRUS 2 (TAT 6-24 HRS) Nasopharyngeal Nasopharyngeal Swab   Specimen: Nasopharyngeal Swab  Result Value Ref Range   SARS Coronavirus 2 NEGATIVE NEGATIVE    MR LUMBAR SPINE WO CONTRAST  Result Date: 12/28/2019 CLINICAL DATA:  Chronic low back pain radiating into both legs, worse over the past 4 years. No known injury. EXAM: MRI LUMBAR SPINE WITHOUT CONTRAST TECHNIQUE: Multiplanar, multisequence MR imaging of the lumbar spine was performed. No intravenous contrast was administered. COMPARISON:  MRI lumbar spine performed at [facility] 02/06/2017. FINDINGS: Segmentation:  Standard. Alignment:  Mild convex left curvature is unchanged.  No listhesis. Vertebrae: No fracture, evidence of discitis, or bone lesion. The patient has marked multilevel degenerative endplate signal change which is  worst at T11-12, L1-2 and L2-3. 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 Conus medullaris and cauda equina: Conus extends to the L1 level. Conus appears normal. Nerve roots of the cauda equina appear buckled from L1-2 to L2-3, unchanged. Paraspinal and other soft tissues: Negative. Disc levels: T10-11 and T11-12 are imaged in the sagittal plane only. Disc bulging at both levels appears worse at T11-12. The central canal is open at both levels. Left foraminal narrowing at T11-12 is noted. T12-L1: Mild facet degenerative change.  Otherwise negative. L2-3: Marked loss of disc space height with a shallow bulge and endplate spur. Moderate facet arthropathy and mild ligamentum flavum thickening. Mild to moderate central canal stenosis and moderate left foraminal narrowing are unchanged. The right foramen appears open. L2-3: Moderate to moderately severe facet arthropathy, bulky ligamentum flavum thickening and a broad-based disc bulge with endplate spur. Severe central canal stenosis, bilateral subarticular recess narrowing and moderate to moderately severe right foraminal narrowing appear unchanged. The left foramen is open. L3-4: Ligamentum flavum thickening and a disc bulge with endplate spur. Moderate central canal stenosis and narrowing of both subarticular recesses is again seen. Moderate to moderately severe foraminal narrowing is worse on the right. No change. L4-5: There is a shallow disc bulge, ligamentum flavum thickening and moderate facet arthropathy. There is mild central canal stenosis and mild to moderate foraminal narrowing, worse on the right. No change. L5-S1: Bilateral facet arthropathy is severe on the right. There is a shallow disc bulge but the central canal is open. There is marked right foraminal narrowing. The exiting right L5 root is compressed. Mild left foraminal narrowing also noted. No change. 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 No change in multilevel spondylosis which is worst at L2-3 where there is severe central canal and bilateral subarticular recess narrowing. Right worse than left foraminal narrowing is also again seen at this level. Moderate central canal stenosis at L3-4 where there is also narrowing of both subarticular recesses and bilateral foraminal narrowing. Marked left foraminal narrowing at L5-S1 where the exiting left L5 root appears compressed. Electronically Signed   By: Inge Rise M.D.   On: 12/28/2019 11:38   DG Lumbar Spine 1 View  Result Date: 01/12/2020 CLINICAL DATA:  Back pain. EXAM: LUMBAR SPINE - 1 VIEW COMPARISON:  None. FINDINGS: Instrumentation is noted at the presumed L3-L4 and L2-L3 levels. Multilevel degenerative changes are noted throughout the visualized portions of the thoracolumbar spine. IMPRESSION: Instrumentation as above. Electronically Signed   By: Constance Holster M.D.   On: 01/12/2020 19:45    Antibiotics:  Anti-infectives (From admission, onward)   Start     Dose/Rate Route Frequency Ordered Stop   01/12/20 2230  ceFAZolin (ANCEF) IVPB 2g/100 mL premix  Status:  Discontinued     2 g 200 mL/hr over 30 Minutes Intravenous Every 8 hours 01/12/20 1839 01/13/20 0755   01/12/20 1508  bacitracin 50,000 Units in sodium chloride 0.9 % 500 mL irrigation  Status:  Discontinued       As needed 01/12/20 1508 01/12/20 1703   01/12/20 0600  ceFAZolin (ANCEF) 3 g in dextrose 5 % 50 mL IVPB     3 g 100 mL/hr over 30 Minutes Intravenous On call to O.R. 01/11/20 0850 01/12/20 1718      Discharge Exam: Blood pressure 125/73, pulse 78, temperature 98.4 F (36.9 C), temperature source Oral, resp. rate 18, height 5\' 9"  (1.753 m), weight 122.5 kg, SpO2 99 %. Neurologic: Grossly normal Ambulating and voiding well  Discharge Medications:   Allergies as of 01/14/2020      Reactions   Codeine    Pt experienced numbness  after taking codeine      Medication List    TAKE these medications   ascorbic acid 500 MG tablet Commonly known as: VITAMIN C Take 500 mg by mouth daily.   aspirin 81 MG EC tablet Take 81 mg by mouth daily.   b complex vitamins tablet Take 1 tablet by mouth daily.   buPROPion 150 MG 12 hr tablet Commonly known as: WELLBUTRIN SR Take 150 mg by mouth daily.   cholecalciferol 25 MCG (1000 UNIT) tablet Commonly known as: VITAMIN D3 Take 1,000 Units by mouth daily.   gabapentin 300 MG capsule Commonly known as: NEURONTIN Take 300 mg by mouth 2 (two) times daily.   ibuprofen 200 MG tablet Commonly known as: ADVIL Take 400 mg by mouth at bedtime.   losartan 100 MG tablet Commonly known as: COZAAR Take 100 mg by mouth daily.   methocarbamol 500 MG tablet Commonly known as: Robaxin Take 1 tablet (500 mg total) by mouth 4 (four) times daily.   PREVAGEN PO Take 1 tablet by mouth daily.   temazepam 15 MG capsule Commonly known as: RESTORIL Take 15 mg by mouth at bedtime.   tetrahydrozoline 0.05 % ophthalmic solution Place 1 drop into both eyes daily with breakfast.       Disposition: home   Final Dx: lumbar laminectomy L1-L4  Discharge Instructions    Call MD for:  difficulty breathing, headache or visual disturbances   Complete by: As directed    Call MD for:  hives   Complete by: As directed    Call MD for:  persistant nausea and vomiting   Complete by: As directed    Call MD for:  redness, tenderness, or signs of infection (pain, swelling, redness, odor or green/yellow discharge around incision site)   Complete by: As directed    Call MD for:  severe uncontrolled pain   Complete by: As directed    Call MD for:  temperature >100.4   Complete by: As directed    Diet - low sodium heart healthy   Complete by: As directed    Driving Restrictions   Complete by: As directed    No driving for 2 weeks, no riding in the car for 1 week   Increase activity  slowly   Complete by: As directed    Lifting restrictions   Complete by: As directed    No lifting more than 8 lbs         Signed: Tiana Loft Catherina Pates 01/14/2020, 10:28 AM

## 2020-01-14 NOTE — Progress Notes (Signed)
Physical Therapy Treatment Patient Details Name: Nathan Moran MRN: [id number] DOB: June 10, 1946 Today's Date: 01/14/2020    History of Present Illness Pt is a 74 y/o male s/p L1-4 laminectomy. PMH includes HTN and sleep apnea.     PT Comments    Pt with good progression of ambulation distance this session, with use of RW and min cuing for safety with use of RW. Pt able to recall 75% of back precautions and apply them to mobility, but requires occasional technique assist from PT. Pt educated on up and walking every hour, starting with 5 min bouts for circulation and to minimize back stiffness. Pt eager to d/c home with assist of his wife, plans to d/c home today. No further questions for PT.    Follow Up Recommendations  Home health PT     Equipment Recommendations  None recommended by PT    Recommendations for Other Services       Precautions / Restrictions Precautions Precautions: Fall;Back Precaution Booklet Issued: No Precaution Comments: Verbally reviewed with pt - no bending, lifting, twisting, arching spine. Pt able to recall 3/4 at start of session. Restrictions Weight Bearing Restrictions: No    Mobility  Bed Mobility Overal bed mobility: Needs Assistance             General bed mobility comments: pt up in chair upon PT arrival to room, requests staying in recliner upon PT exit.  Transfers Overall transfer level: Needs assistance Equipment used: Rolling walker (2 wheeled) Transfers: Sit to/from Stand Sit to Stand: Supervision         General transfer comment: Sit to stand x2, from recliner, with supervision for safety. Pt with good hand placement with rising/sitting, verbal cuing for stepping back until LEs touch chair before sitting down and emphasizing bending from hips/knees with sit<>stand.  Ambulation/Gait Ambulation/Gait assistance: Supervision;Min guard Gait Distance (Feet): 160 Feet Assistive device: Rolling walker (2 wheeled) Gait  Pattern/deviations: Step-to pattern;Decreased stride length;Trunk flexed Gait velocity: Decreased   General Gait Details: Min guard to supervision for safey, verbal cuing for upright posture during standing and placement inside RW during gait.   Stairs             Wheelchair Mobility    Modified Rankin (Stroke Patients Only)       Balance Overall balance assessment: Needs assistance Sitting-balance support: No upper extremity supported;Feet supported Sitting balance-Leahy Scale: Good     Standing balance support: Bilateral upper extremity supported;During functional activity Standing balance-Leahy Scale: Poor Standing balance comment: reliant on external support                            Cognition Arousal/Alertness: Awake/alert Behavior During Therapy: WFL for tasks assessed/performed Overall Cognitive Status: Within Functional Limits for tasks assessed                                        Exercises      General Comments General comments (skin integrity, edema, etc.): DOE 2/4      Pertinent Vitals/Pain Pain Assessment: Faces Faces Pain Scale: Hurts even more Pain Location: back Pain Descriptors / Indicators: Aching;Operative site guarding Pain Intervention(s): Limited activity within patient's tolerance;Monitored during session;Repositioned    Home Living Family/patient expects to be discharged to:: Private residence Living Arrangements: Spouse/significant other;Children Available Help at Discharge: Family Type of Home: House Home Access: Ramped  entrance   Home Layout: One level Home Equipment: Walker - 2 wheels      Prior Function Level of Independence: Independent      Comments: Was independent, but reports multiple falls. [place]. Driving.   PT Goals (current goals can now be found in the care plan section) Acute Rehab PT Goals Patient Stated Goal: to go home PT Goal Formulation: With patient Time For Goal  Achievement: 01/27/20 Potential to Achieve Goals: Good Progress towards PT goals: Progressing toward goals    Frequency    Min 5X/week      PT Plan Current plan remains appropriate    Co-evaluation              AM-PAC PT "6 Clicks" Mobility   Outcome Measure  Help needed turning from your back to your side while in a flat bed without using bedrails?: A Little Help needed moving from lying on your back to sitting on the side of a flat bed without using bedrails?: A Little Help needed moving to and from a bed to a chair (including a wheelchair)?: A Little Help needed standing up from a chair using your arms (e.g., wheelchair or bedside chair)?: A Little Help needed to walk in hospital room?: A Little Help needed climbing 3-5 steps with a railing? : A Little 6 Click Score: 18    End of Session Equipment Utilized During Treatment: Gait belt Activity Tolerance: Patient tolerated treatment well Patient left: in chair;with call bell/phone within reach Nurse Communication: Mobility status PT Visit Diagnosis: Unsteadiness on feet (R26.81);Muscle weakness (generalized) (M62.81);Pain Pain - part of body: (back)     Time: 8588-5027 PT Time Calculation (min) (ACUTE ONLY): 13 min  Charges:  $Gait Training: 8-22 mins                     Zarin Knupp E, PT [place] Pager [phone]  Office [phone]   September Mormile D Gretna Bergin 01/14/2020, 11:40 AM

## 2020-01-14 NOTE — Progress Notes (Signed)
Patient discharged home with wife. Hemovac and peripheral IV removed per order. Order to remove hemovac confirmed with [facility]. All personal belongings packed, discharge paperwork gone over, and patient escorted out in wheelchair with mask on.

## 2020-01-14 NOTE — Progress Notes (Signed)
Patient awake and alert this morning.  Rested well. Pain assessment done and patient states he only has minimal pain this morning.

## 2020-01-14 NOTE — Evaluation (Signed)
Occupational Therapy Evaluation Patient Details Name: Nathan Moran MRN: [id number] DOB: 1946-01-24 Today's Date: 01/14/2020    History of Present Illness Pt is a 74 y/o male s/p L1-4 laminectomy. PMH includes HTN and sleep apnea.    Clinical Impression   PTA, pt was living with his wife and was independent. Currently, pt requires Supervision-Min guard for ADLs and functional mobility using RW. Provided handout and education on bed mobility, grooming, LB ADLs, toileting, and shower transfer; pt demonstrated understanding. Answered all pt questions. Recommend dc home once medically stable per physician. All acute OT needs met and will sign off. Thank you.     Follow Up Recommendations  No OT follow up    Equipment Recommendations  None recommended by OT    Recommendations for Other Services       Precautions / Restrictions Precautions Precautions: Fall;Back Precaution Booklet Issued: No Precaution Comments: Reviewed back precautions. Pt verbalize 2/3 - cues for no lifting. Restrictions Weight Bearing Restrictions: No      Mobility Bed Mobility Overal bed mobility: Needs Assistance Bed Mobility: Rolling;Sidelying to Sit Rolling: Min guard Sidelying to sit: Min guard       General bed mobility comments: Min guard A for safety  Transfers Overall transfer level: Needs assistance Equipment used: Rolling walker (2 wheeled) Transfers: Sit to/from Stand Sit to Stand: Supervision;Min guard         General transfer comment: Supervision-Min Guard A for safety    Balance Overall balance assessment: Needs assistance Sitting-balance support: No upper extremity supported;Feet supported Sitting balance-Leahy Scale: Good     Standing balance support: During functional activity;No upper extremity supported Standing balance-Leahy Scale: Fair Standing balance comment: reliant on external support                           ADL either performed or assessed with  clinical judgement   ADL Overall ADL's : Needs assistance/impaired Eating/Feeding: Independent;Sitting   Grooming: Set up;Standing;Supervision/safety   Upper Body Bathing: Supervision/ safety;Set up;Sitting   Lower Body Bathing: Min guard;Sit to/from stand   Upper Body Dressing : Supervision/safety;Set up;Sitting   Lower Body Dressing: Min guard;Sit to/from stand   Toilet Transfer: Min guard;RW;Ambulation(simulated to recliner)     Toileting - Clothing Manipulation Details (indicate cue type and reason): Educated on AE for toilet hygiene and different techniques   Tub/Shower Transfer Details (indicate cue type and reason): Educated on safe shower trnasfer Functional mobility during ADLs: Min guard;Rolling walker General ADL Comments: Provided education on compensatory tehcniques for bed mobility, UB ADLs, LB ADLs, toileting, and shower transfer. Pt demonstrating and verablzing understanding     Vision         Perception     Praxis      Pertinent Vitals/Pain Pain Assessment: Faces Faces Pain Scale: Hurts even more Pain Location: back Pain Descriptors / Indicators: Aching;Operative site guarding Pain Intervention(s): Monitored during session;Limited activity within patient's tolerance;Repositioned     Hand Dominance     Extremity/Trunk Assessment Upper Extremity Assessment Upper Extremity Assessment: Overall WFL for tasks assessed   Lower Extremity Assessment Lower Extremity Assessment: Defer to PT evaluation   Cervical / Trunk Assessment Cervical / Trunk Assessment: Other exceptions Cervical / Trunk Exceptions: s/p lumbar surgery    Communication Communication Communication: No difficulties   Cognition Arousal/Alertness: Awake/alert Behavior During Therapy: WFL for tasks assessed/performed Overall Cognitive Status: Within Functional Limits for tasks assessed  General Comments  VSS    Exercises      Shoulder Instructions      Home Living Family/patient expects to be discharged to:: Private residence Living Arrangements: Spouse/significant other;Children Available Help at Discharge: Family Type of Home: House Home Access: Ramped entrance     Home Layout: One level     Bathroom Shower/Tub: Occupational psychologist: Stephen: Environmental consultant - 2 wheels;Shower seat;Hand held shower head;Adaptive equipment Adaptive Equipment: Reacher        Prior Functioning/Environment Level of Independence: Independent        Comments: Was independent, but reports multiple falls. [place]. Driving.        OT Problem List: Impaired balance (sitting and/or standing);Decreased activity tolerance;Decreased knowledge of use of DME or AE;Decreased knowledge of precautions;Pain      OT Treatment/Interventions:      OT Goals(Current goals can be found in the care plan section) Acute Rehab OT Goals Patient Stated Goal: to go home OT Goal Formulation: All assessment and education complete, DC therapy  OT Frequency:     Barriers to D/C:            Co-evaluation              AM-PAC OT "6 Clicks" Daily Activity     Outcome Measure Help from another person eating meals?: None Help from another person taking care of personal grooming?: None Help from another person toileting, which includes using toliet, bedpan, or urinal?: A Little Help from another person bathing (including washing, rinsing, drying)?: A Little Help from another person to put on and taking off regular upper body clothing?: None Help from another person to put on and taking off regular lower body clothing?: A Little 6 Click Score: 21   End of Session Equipment Utilized During Treatment: Rolling walker Nurse Communication: Mobility status  Activity Tolerance: Patient tolerated treatment well Patient left: in chair;with call bell/phone within reach(with PT)  OT Visit Diagnosis: Unsteadiness on  feet (R26.81);Other abnormalities of gait and mobility (R26.89);Muscle weakness (generalized) (M62.81);Pain Pain - part of body: (Back)                Time: 6384-6659 OT Time Calculation (min): 24 min Charges:  OT General Charges $OT Visit: 1 Visit OT Evaluation $OT Eval Low Complexity: 1 Low OT Treatments $Self Care/Home Management : 8-22 mins  Carrye Goller MSOT, OTR/L Acute Rehab Pager: [phone] Office: [place] 01/14/2020, 2:19 PM

## 2020-02-07 ENCOUNTER — Encounter (HOSPITAL_COMMUNITY): Payer: Self-pay

## 2020-02-07 ENCOUNTER — Telehealth (HOSPITAL_COMMUNITY): Payer: Self-pay

## 2020-02-07 NOTE — Telephone Encounter (Signed)
02/04/20 4:10pm LVM for patient to call and schedule appt.[facility]  02/03/20 : Received order from Dr Wynetta Emery for Venous doppler R/O DVT

## 2020-02-10 ENCOUNTER — Ambulatory Visit (HOSPITAL_COMMUNITY)
Admission: RE | Admit: 2020-02-10 | Discharge: 2020-02-10 | Disposition: A | Discharge status: Home / Self Care | Payer: Medicare HMO | Source: Ambulatory Visit | Attending: Vascular Surgery | Admitting: Vascular Surgery

## 2020-02-10 ENCOUNTER — Other Ambulatory Visit (HOSPITAL_COMMUNITY): Payer: Self-pay | Admitting: Neurosurgery

## 2020-02-10 ENCOUNTER — Other Ambulatory Visit: Payer: Self-pay

## 2020-02-10 DIAGNOSIS — R600 Localized edema: Secondary | ICD-10-CM

## 2021-03-01 IMAGING — MR MR LUMBAR SPINE W/O CM
5 series · 42 of 48 positions shown · non-contrast
Comparison: MRI lumbar spine performed at [REDACTED] 02/06/2017.

CLINICAL DATA: Chronic low back pain radiating into both legs,
worse over the past 4 years. No known injury.

EXAM:
MRI LUMBAR SPINE WITHOUT CONTRAST
TECHNIQUE: Multiplanar, multisequence MR imaging of the lumbar spine was
performed. No intravenous contrast was administered.

[Series 3: tirm sag · sagittal · 4.0mm · 0.55mm/px · 6 of 14 slices shown]
[im 1/14]
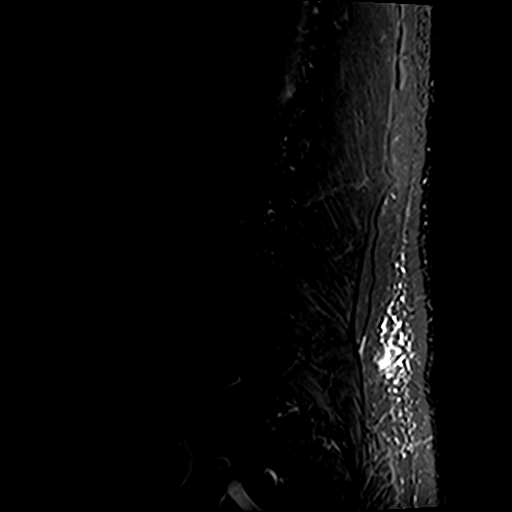
[im 3/14]
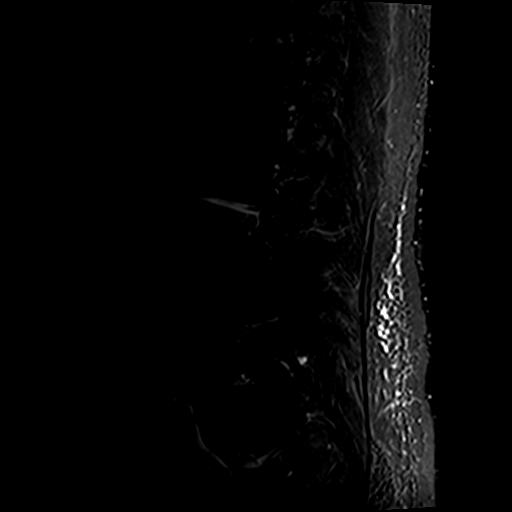
[im 6/14]
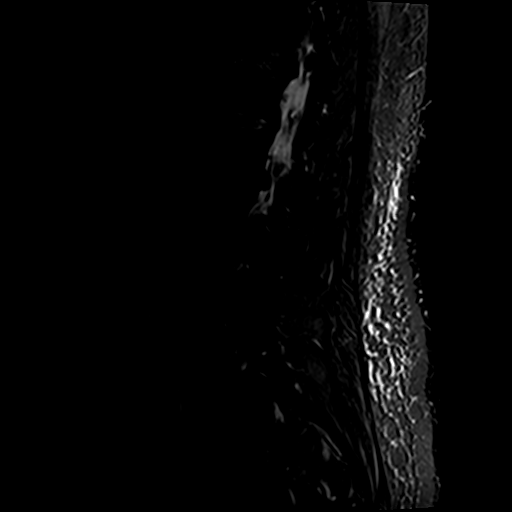
[im 8/14]
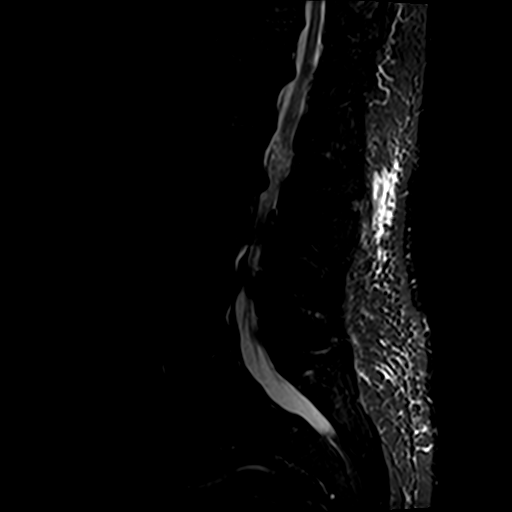
[im 11/14]
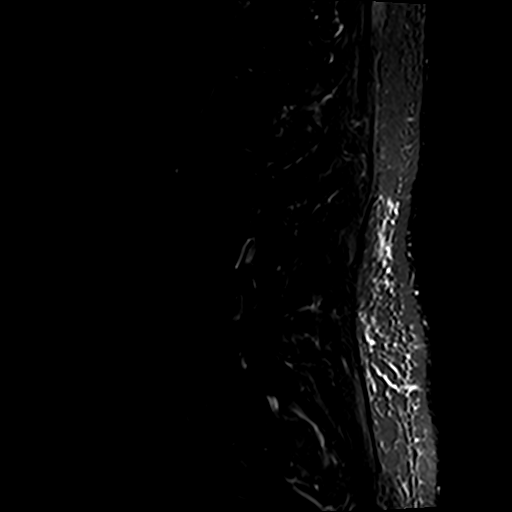
[im 14/14]
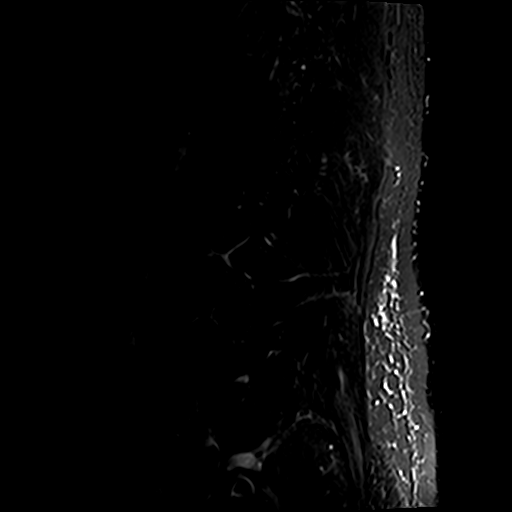

[Series 4: T2 post-contrast · sagittal · 4.0mm · 1.09mm/px · 6 of 14 slices shown]
[im 1/14]
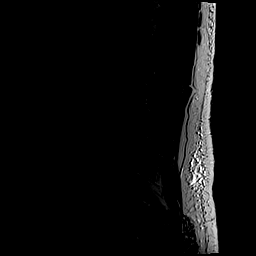
[im 3/14]
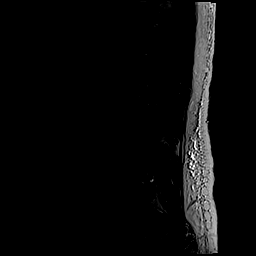
[im 6/14]
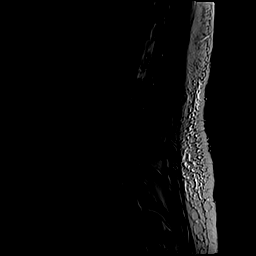
[im 8/14]
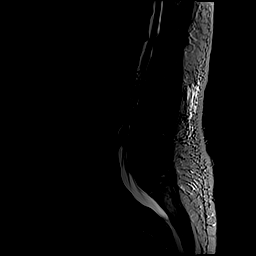
[im 11/14]
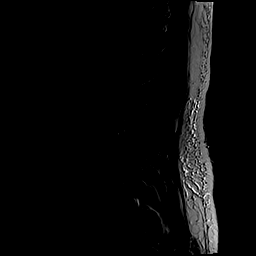
[im 14/14]
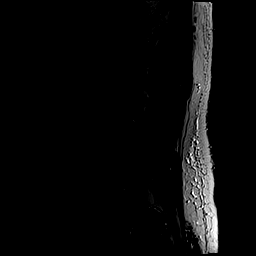

[Series 5: T1 · sagittal · 4.0mm · 0.88mm/px · 6 of 14 slices shown (1 of 2)]
[im 1/14]
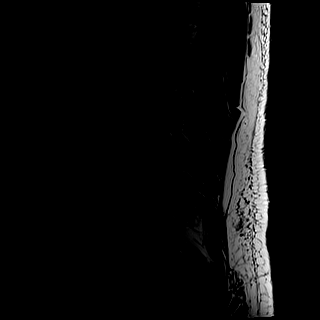
[im 3/14]
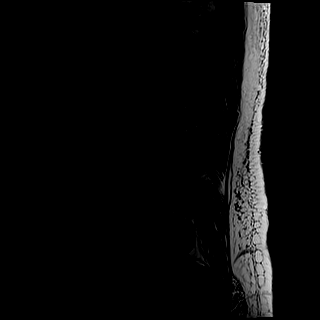
[im 6/14]
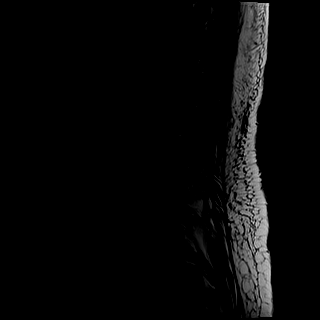
[im 8/14]
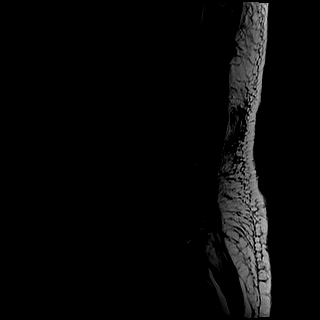
[im 11/14]
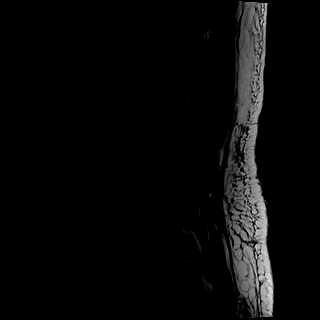
[im 14/14]
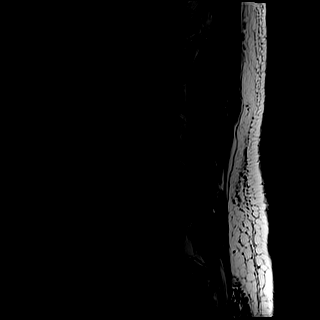

[Series 6: T1 · axial · 4.0mm · 0.78mm/px · z∈[-41,+156]mm · 9 of 38 slices shown (2 of 2)]
[im 1/38]
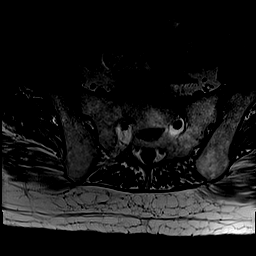
[im 6/38]
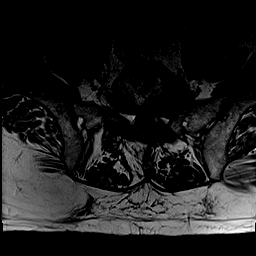
[im 11/38]
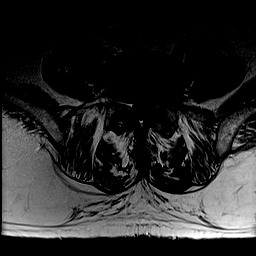
[im 16/38]
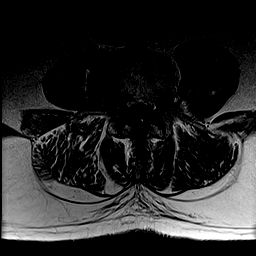
[im 19/38]
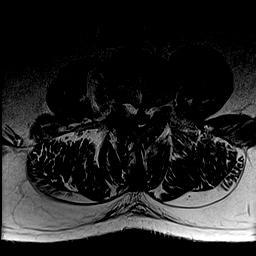
[im 22/38]
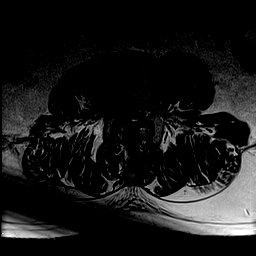
[im 27/38]
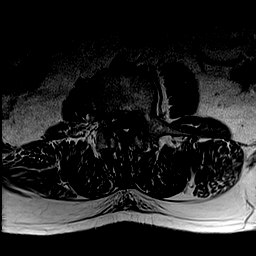
[im 32/38]
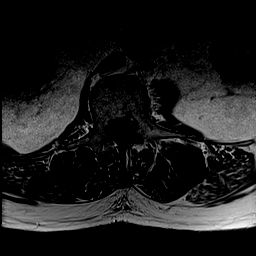
[im 38/38]
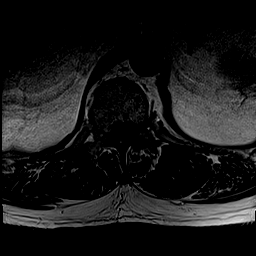

[Series 7: T2 · axial · 4.0mm · 0.78mm/px · z∈[-41,+156]mm · 15 of 38 slices shown]
[im 1/38]
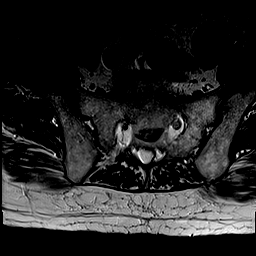
[im 3/38]
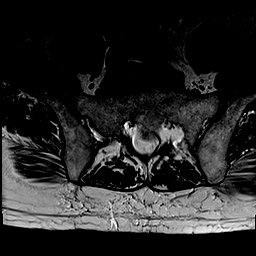
[im 6/38]
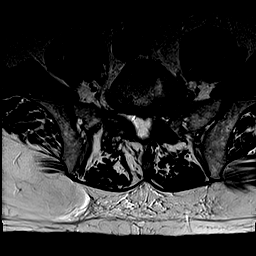
[im 8/38]
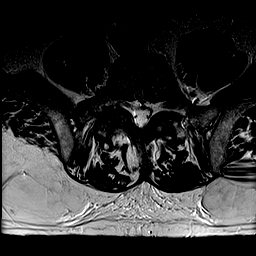
[im 11/38]
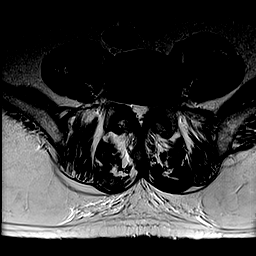
[im 14/38]
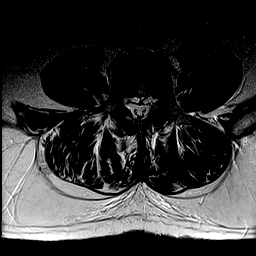
[im 16/38]
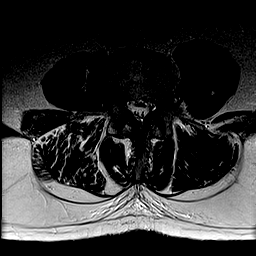
[im 19/38]
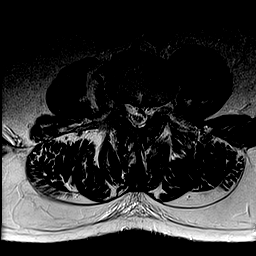
[im 22/38]
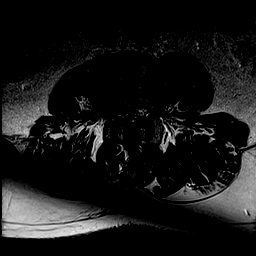
[im 24/38]
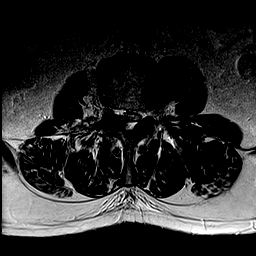
[im 27/38]
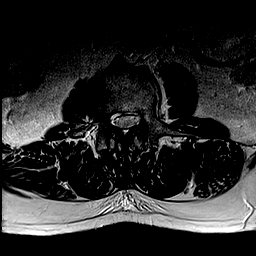
[im 30/38]
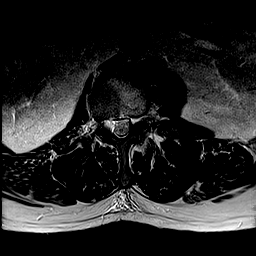
[im 32/38]
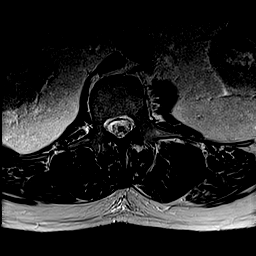
[im 35/38]
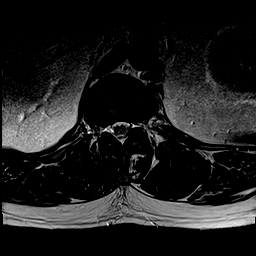
[im 38/38]
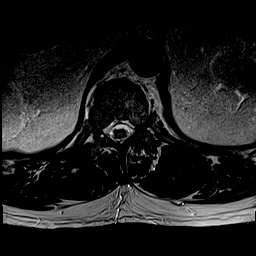

[42 of 48 positions shown; findings below may reference images not displayed]

FINDINGS: Segmentation:  Standard.

Alignment:  Mild convex left curvature is unchanged.  No listhesis.

Vertebrae: No fracture, evidence of discitis, or bone lesion. The
patient has marked multilevel degenerative endplate signal change
which is worst at T11-12, L1-2 and L2-3. Congenitally narrow central
canal in the upper lumbar segments due to short pedicle length is
noted.

Conus medullaris and cauda equina: Conus extends to the L1 level.
Conus appears normal. Nerve roots of the cauda equina appear buckled
from L1-2 to L2-3, unchanged.

Paraspinal and other soft tissues: Negative.

Disc levels:

T10-11 and T11-12 are imaged in the sagittal plane only. Disc
bulging at both levels appears worse at T11-12. The central canal is
open at both levels. Left foraminal narrowing at T11-12 is noted.

T12-L1: Mild facet degenerative change.  Otherwise negative.

L2-3: Marked loss of disc space height with a shallow bulge and
endplate spur. Moderate facet arthropathy and mild ligamentum flavum
thickening. Mild to moderate central canal stenosis and moderate
left foraminal narrowing are unchanged. The right foramen appears
open.

L2-3: Moderate to moderately severe facet arthropathy, bulky
ligamentum flavum thickening and a broad-based disc bulge with
endplate spur. Severe central canal stenosis, bilateral subarticular
recess narrowing and moderate to moderately severe right foraminal
narrowing appear unchanged. The left foramen is open.

L3-4: Ligamentum flavum thickening and a disc bulge with endplate
spur. Moderate central canal stenosis and narrowing of both
subarticular recesses is again seen. Moderate to moderately severe
foraminal narrowing is worse on the right. No change.

L4-5: There is a shallow disc bulge, ligamentum flavum thickening
and moderate facet arthropathy. There is mild central canal stenosis
and mild to moderate foraminal narrowing, worse on the right. No
change.

L5-S1: Bilateral facet arthropathy is severe on the right. There is
a shallow disc bulge but the central canal is open. There is marked
right foraminal narrowing. The exiting right L5 root is compressed.
Mild left foraminal narrowing also noted. No change.
IMPRESSION: Congenitally narrow central canal due to short pedicle length in the
upper lumbar segments.

No change in multilevel spondylosis which is worst at L2-3 where
there is severe central canal and bilateral subarticular recess
narrowing. Right worse than left foraminal narrowing is also again
seen at this level.

Moderate central canal stenosis at L3-4 where there is also
narrowing of both subarticular recesses and bilateral foraminal
narrowing.

Marked left foraminal narrowing at L5-S1 where the exiting left L5
root appears compressed.

## 2021-03-16 IMAGING — CR DG LUMBAR SPINE 1V
1 series · 1 of 1 positions shown · non-contrast
Comparison: None.

CLINICAL DATA: Back pain.

EXAM:
LUMBAR SPINE - 1 VIEW

[lateral]
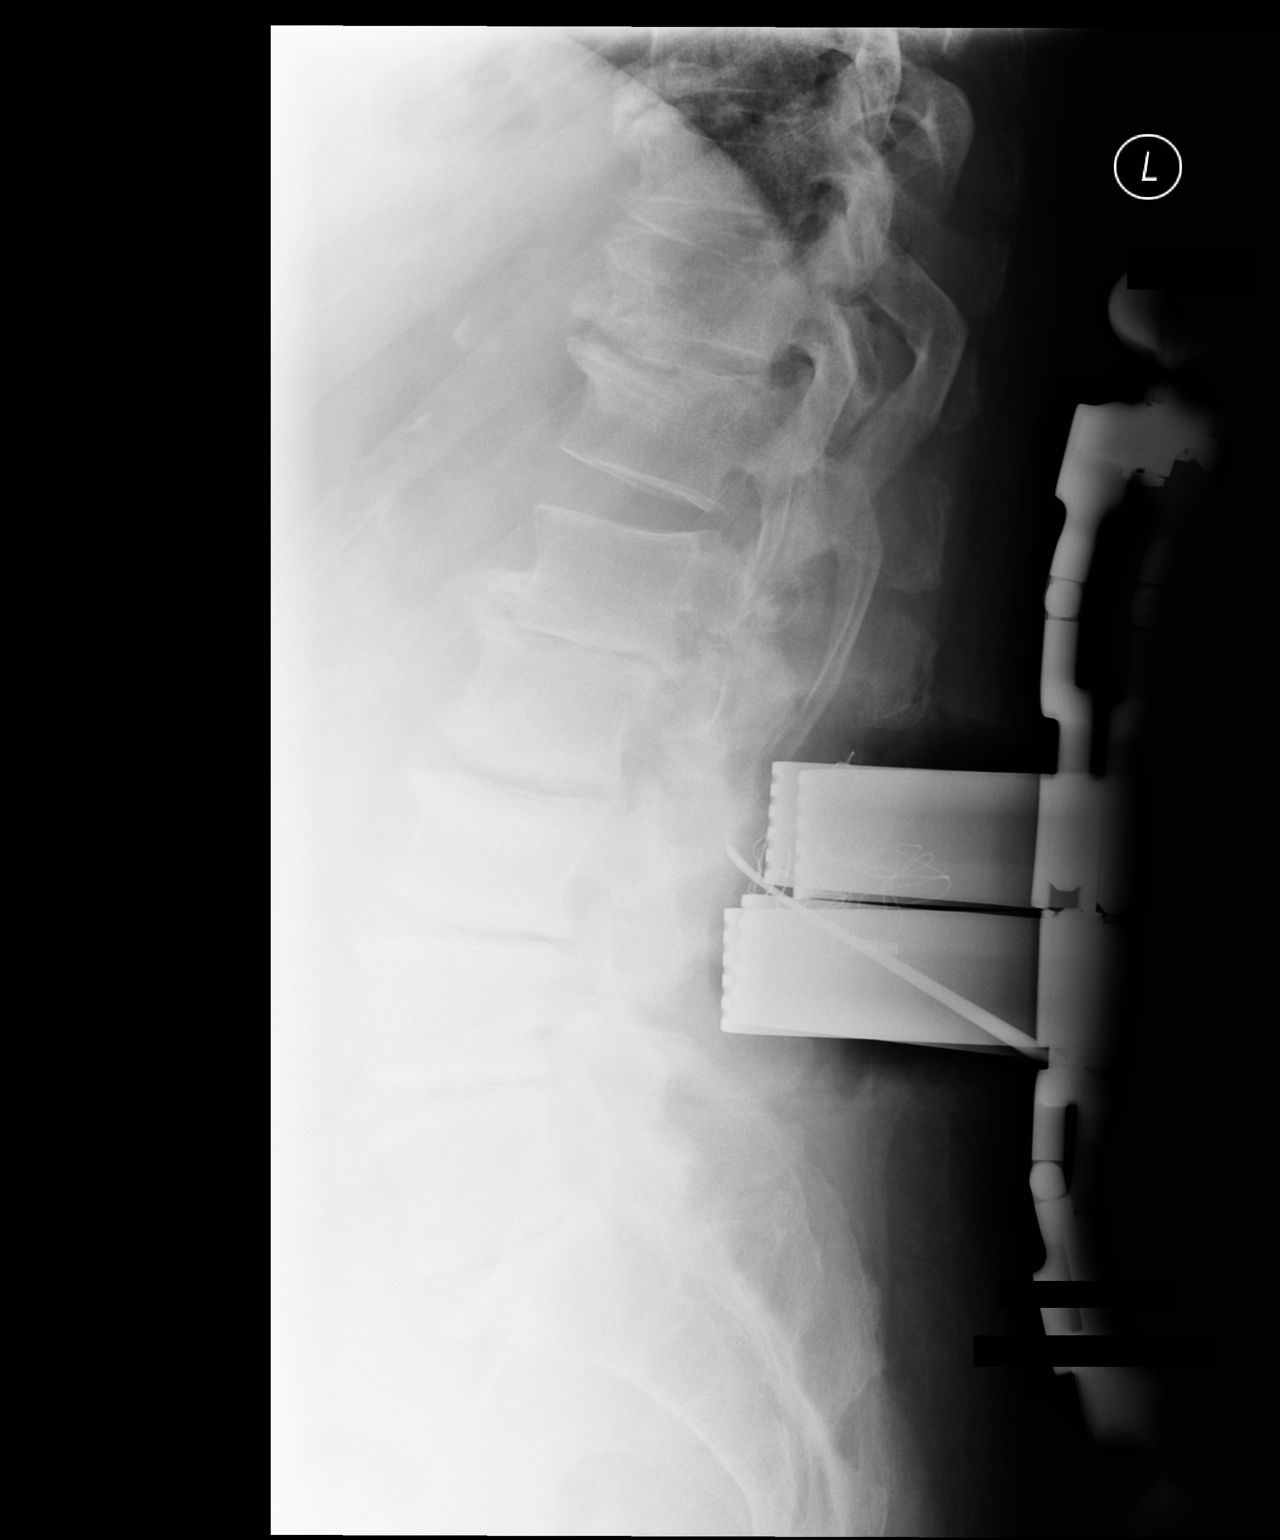

[1 of 1 positions shown; findings below may reference images not displayed]

FINDINGS: Instrumentation is noted at the presumed L3-L4 and L2-L3 levels.
Multilevel degenerative changes are noted throughout the visualized
portions of the thoracolumbar spine.
IMPRESSION: Instrumentation as above.

## 2022-01-03 NOTE — Telephone Encounter (Signed)
This encounter was created in error - please disregard.

## 2022-08-13 NOTE — H&P (Signed)
TOTAL KNEE ADMISSION H&P  Patient is being admitted for left total knee arthroplasty.  Subjective:  Chief Complaint: Left knee pain.  HPI: Nathan Moran, 76 y.o. male has a history of pain and functional disability in the left knee due to arthritis and has failed non-surgical conservative treatments for greater than 12 weeks to include supervised PT with diminished ADL's post treatment, use of assistive devices, and activity modification. Onset of symptoms was gradual, starting  several  years ago with gradually worsening course since that time. The patient noted no past surgery on the left knee.  Patient currently rates pain in the left knee at 8 out of 10 with activity. Patient has night pain, worsening of pain with activity and weight bearing, pain that interferes with activities of daily living, and pain with passive range of motion. Patient has evidence of  bone-on-bone arthritis in the patellofemoral and lateral compartments of the left knee  by imaging studies. There is no active infection.  Patient Active Problem List   Diagnosis Date Noted   Spinal stenosis of lumbar region 01/12/2020    Past Medical History:  Diagnosis Date   Anxiety    Bronchitis    GERD (gastroesophageal reflux disease)    History of kidney stones    Hypertension    Sleep apnea    uses CPAP    Past Surgical History:  Procedure Laterality Date   CARDIAC CATHETERIZATION     2010   EYE SURGERY     Bialteral cataracts   LEG SURGERY     Right   LUMBAR LAMINECTOMY/DECOMPRESSION MICRODISCECTOMY N/A 01/12/2020   Procedure: Bilateral Lumbar One-Two, Lumbar Two-Three, Lumbar Three-Four Laminectomy and Foraminotomy;  Surgeon: Kary Kos, MD;  Location: [facility];  Service: Neurosurgery;  Laterality: N/A;   PILONIDAL CYST EXCISION     TONSILLECTOMY      Prior to Admission medications   Medication Sig Start Date End Date Taking? Authorizing Provider  Apoaequorin (PREVAGEN PO) Take 1 tablet by mouth daily.     [provider]  ascorbic acid (VITAMIN C) 500 MG tablet Take 500 mg by mouth daily.    [provider]  aspirin 81 MG EC tablet Take 81 mg by mouth daily. 09/24/17   [provider]  b complex vitamins tablet Take 1 tablet by mouth daily.    [provider]  buPROPion (WELLBUTRIN SR) 150 MG 12 hr tablet Take 150 mg by mouth daily. 11/28/19   [provider]  cholecalciferol (VITAMIN D3) 25 MCG (1000 UNIT) tablet Take 1,000 Units by mouth daily.    [provider]  gabapentin (NEURONTIN) 300 MG capsule Take 300 mg by mouth 2 (two) times daily.    [provider]  ibuprofen (ADVIL) 200 MG tablet Take 400 mg by mouth at bedtime.    [provider]  losartan (COZAAR) 100 MG tablet Take 100 mg by mouth daily. 11/28/19   [provider]  methocarbamol (ROBAXIN) 500 MG tablet Take 1 tablet (500 mg total) by mouth 4 (four) times daily. 01/14/20   Meyran, Ocie Cornfield, NP  temazepam (RESTORIL) 15 MG capsule Take 15 mg by mouth at bedtime. 12/16/19   [provider]  tetrahydrozoline 0.05 % ophthalmic solution Place 1 drop into both eyes daily with breakfast.    [provider]    Allergies  Allergen Reactions   Codeine     Pt experienced numbness after taking codeine    Social History   Socioeconomic History  Marital status: Married    Spouse name: Not on file   Number of children: Not on file   Years of education: Not on file   Highest education level: Not on file  Occupational History   Not on file  Tobacco Use   Smoking status: Never   Smokeless tobacco: Never  Vaping Use   Vaping Use: Never used  Substance and Sexual Activity   Alcohol use: Yes    Comment: occ- 2 drinks a year   Drug use: Never   Sexual activity: Not on file  Other Topics Concern   Not on file  Social History Narrative   Not on file   Social Determinants of Health   Financial Resource Strain: Not on file  Food  Insecurity: Not on file  Transportation Needs: Not on file  Physical Activity: Not on file  Stress: Not on file  Social Connections: Not on file  Intimate Partner Violence: Not on file    Tobacco Use: Low Risk  (12/02/2020)   Patient History    Smoking Tobacco Use: Never    Smokeless Tobacco Use: Never    Passive Exposure: Not on file   Social History   Substance and Sexual Activity  Alcohol Use Yes   Comment: occ- 2 drinks a year    No family history on file.  Review of Systems  Constitutional:  Negative for chills and fever.  HENT:  Negative for congestion, sore throat and tinnitus.   Eyes:  Negative for double vision, photophobia and pain.  Respiratory:  Negative for cough, shortness of breath and wheezing.   Cardiovascular:  Negative for chest pain, palpitations and orthopnea.  Gastrointestinal:  Negative for heartburn, nausea and vomiting.  Genitourinary:  Negative for dysuria, frequency and urgency.  Musculoskeletal:  Positive for joint pain.  Neurological:  Negative for dizziness, weakness and headaches.    Objective:  Physical Exam: Well nourished and well developed.  General: Alert and oriented x3, cooperative and pleasant, no acute distress.  Head: normocephalic, atraumatic, neck supple.  Eyes: EOMI.  Musculoskeletal:  Left Knee Exam:  Minimal effusion present.  Varus deformity.  The range of motion is: 5 to 120 degrees.  Positive crepitus on range of motion of the knee.  Positive medial joint line tenderness.  Positive lateral joint line tenderness.  Fair amount of AP laxity.  Varus/valgus laxity.  Calves soft and nontender. Motor function intact in LE. Strength 5/5 LE bilaterally. Neuro: Distal pulses 2+. Sensation to light touch intact in LE.  Imaging Review Plain radiographs demonstrate severe degenerative joint disease of the left knee. The overall alignment is mild varus. The bone quality appears to be adequate for age and reported activity  level.  Assessment/Plan:  End stage arthritis, left knee   The patient history, physical examination, clinical judgment of the provider and imaging studies are consistent with end stage degenerative joint disease of the left knee and total knee arthroplasty is deemed medically necessary. The treatment options including medical management, injection therapy arthroscopy and arthroplasty were discussed at length. The risks and benefits of total knee arthroplasty were presented and reviewed. The risks due to aseptic loosening, infection, stiffness, patella tracking problems, thromboembolic complications and other imponderables were discussed. The patient acknowledged the explanation, agreed to proceed with the plan and consent was signed. Patient is being admitted for inpatient treatment for surgery, pain control, PT, OT, prophylactic antibiotics, VTE prophylaxis, progressive ambulation and ADLs and discharge planning. The patient is planning to be discharged  home .   Patient's anticipated LOS is less than 2 midnights, meeting these requirements: - Lives within 1 hour of care - Has a competent adult at home to recover with post-op recover - NO history of  - Chronic pain requiring opioids  - Diabetes  - Coronary Artery Disease  - Heart failure  - Heart attack  - Stroke  - Cardiac arrhythmia  - Respiratory Failure/COPD  - Renal failure  - Anemia  - Advanced Liver disease  Therapy Plans: Outpatient therapy at [facility]) Disposition: Home with wife Planned DVT Prophylaxis: Xarelto 20 mg QD DME Needed: Gilford Rile PCP: Synetta Shadow, PA-C (clearance received) Hematologist: Marcy Panning, MD (clearance received) TXA: Topical / Oral Allergies: Codeine Anesthesia Concerns: None BMI: 38 Last HgbA1c: Not diabetic Pharmacy: [facility] ([facility])  Other: - Hx DVT - Being treated with Synetta Shadow currently for UTI (chronic issue) - Has tolerated oxycodone in the past   -  Patient was instructed on what medications to stop prior to surgery. - Follow-up visit in 2 weeks with Dr. Wynelle Link - Begin physical therapy following surgery - Pre-operative lab work as pre-surgical testing - Prescriptions will be provided in hospital at time of discharge  Theresa Duty, PA-C Orthopedic Surgery [facility] [place]

## 2022-08-19 NOTE — Patient Instructions (Signed)
DUE TO COVID-19 ONLY TWO VISITORS  (aged 75 and older)  ARE ALLOWED TO COME WITH YOU AND STAY IN THE WAITING ROOM ONLY DURING PRE OP AND PROCEDURE.   **NO VISITORS ARE ALLOWED IN THE SHORT STAY AREA OR RECOVERY ROOM!!**  IF YOU WILL BE ADMITTED INTO THE HOSPITAL YOU ARE ALLOWED ONLY FOUR SUPPORT PEOPLE DURING VISITATION HOURS ONLY (7 AM -8PM)   The support person(s) must pass our screening, gel in and out, and wear a mask at all times, including in the patient's room. Patients must also wear a mask when staff or their support person are in the room. Visitors GUEST BADGE MUST BE WORN VISIBLY  One adult visitor may remain with you overnight and MUST be in the room by 8 P.M.     Your procedure is scheduled on: 09/02/22   Report to [facility] Main Entrance    Report to admitting at : 8:00  AM   Call this number if you have problems the morning of surgery [phone]   Do not eat food :After Midnight.   After Midnight you may have the following liquids until : 7:00 AM DAY OF SURGERY  Water Black Coffee (sugar ok, NO MILK/CREAM OR CREAMERS)  Tea (sugar ok, NO MILK/CREAM OR CREAMERS) regular and decaf                             Plain Jell-O (NO RED)                                           Fruit ices (not with fruit pulp, NO RED)                                     Popsicles (NO RED)                                                                  Juice: apple, WHITE grape, WHITE cranberry Sports drinks like Gatorade (NO RED)              Drink  Ensure drink AT:  7:00 AM the day of  surgery.    The day of surgery:  Drink ONE (1) Pre-Surgery Clear Ensure or G2 at AM the morning of surgery. Drink in one sitting. Do not sip.  This drink was given to you during your hospital  pre-op appointment visit. Nothing else to drink after completing the  Pre-Surgery Clear Ensure or G2.          If you have questions, please contact your surgeon's office.   Oral Hygiene is also  important to reduce your risk of infection.                                    Remember - BRUSH YOUR TEETH THE MORNING OF SURGERY WITH YOUR REGULAR TOOTHPASTE   Do NOT smoke after Midnight   Take these medicines the morning of surgery with  A SIP OF WATER: gabapentin,bupropion.Colchicine as needed.  Bring CPAP mask and tubing day of surgery.                              You may not have any metal on your body including hair pins, jewelry, and body piercing             Do not wear  lotions, powders, perfumes/cologne, or deodorant              Men may shave face and neck.   Do not bring valuables to the hospital. [place].   Contacts, dentures or bridgework may not be worn into surgery.   Bring small overnight bag day of surgery.   DO NOT [place]. PHARMACY WILL DISPENSE MEDICATIONS LISTED ON YOUR MEDICATION LIST TO YOU DURING YOUR ADMISSION [place]!    Patients discharged on the day of surgery will not be allowed to drive home.  Someone NEEDS to stay with you for the first 24 hours after anesthesia.   Special Instructions: Bring a copy of your healthcare power of attorney and living will documents         the day of surgery if you haven't scanned them before.              Please read over the following fact sheets you were given: IF YOU HAVE QUESTIONS ABOUT YOUR PRE-OP INSTRUCTIONS PLEASE CALL [phone]     [facility] - Preparing for Surgery Before surgery, you can play an important role.  Because skin is not sterile, your skin needs to be as free of germs as possible.  You can reduce the number of germs on your skin by washing with CHG (chlorahexidine gluconate) soap before surgery.  CHG is an antiseptic cleaner which kills germs and bonds with the skin to continue killing germs even after washing. Please DO NOT use if you have an allergy to CHG or antibacterial soaps.  If your skin  becomes reddened/irritated stop using the CHG and inform your nurse when you arrive at Short Stay. Do not shave (including legs and underarms) for at least 48 hours prior to the first CHG shower.  You may shave your face/neck. Please follow these instructions carefully:  1.  Shower with CHG Soap the night before surgery and the  morning of Surgery.  2.  If you choose to wash your hair, wash your hair first as usual with your  normal  shampoo.  3.  After you shampoo, rinse your hair and body thoroughly to remove the  shampoo.                           4.  Use CHG as you would any other liquid soap.  You can apply chg directly  to the skin and wash                       Gently with a scrungie or clean washcloth.  5.  Apply the CHG Soap to your body ONLY FROM THE NECK DOWN.   Do not use on face/ open  Wound or open sores. Avoid contact with eyes, ears mouth and genitals (private parts).                       Wash face,  Genitals (private parts) with your normal soap.             6.  Wash thoroughly, paying special attention to the area where your surgery  will be performed.  7.  Thoroughly rinse your body with warm water from the neck down.  8.  DO NOT shower/wash with your normal soap after using and rinsing off  the CHG Soap.                9.  Pat yourself dry with a clean towel.            10.  Wear clean pajamas.            11.  Place clean sheets on your bed the night of your first shower and do not  sleep with pets. Day of Surgery : Do not apply any lotions/deodorants the morning of surgery.  Please wear clean clothes to the hospital/surgery center.  FAILURE TO FOLLOW THESE INSTRUCTIONS MAY RESULT IN THE CANCELLATION OF YOUR SURGERY PATIENT SIGNATURE_________________________________  NURSE SIGNATURE__________________________________  ________________________________________________________________________   Adam Phenix  An incentive spirometer is a  tool that can help keep your lungs clear and active. This tool measures how well you are filling your lungs with each breath. Taking long deep breaths may help reverse or decrease the chance of developing breathing (pulmonary) problems (especially infection) following: A long period of time when you are unable to move or be active. BEFORE THE PROCEDURE  If the spirometer includes an indicator to show your best effort, your nurse or respiratory therapist will set it to a desired goal. If possible, sit up straight or lean slightly forward. Try not to slouch. Hold the incentive spirometer in an upright position. INSTRUCTIONS FOR USE  Sit on the edge of your bed if possible, or sit up as far as you can in bed or on a chair. Hold the incentive spirometer in an upright position. Breathe out normally. Place the mouthpiece in your mouth and seal your lips tightly around it. Breathe in slowly and as deeply as possible, raising the piston or the ball toward the top of the column. Hold your breath for 3-5 seconds or for as long as possible. Allow the piston or ball to fall to the bottom of the column. Remove the mouthpiece from your mouth and breathe out normally. Rest for a few seconds and repeat Steps 1 through 7 at least 10 times every 1-2 hours when you are awake. Take your time and take a few normal breaths between deep breaths. The spirometer may include an indicator to show your best effort. Use the indicator as a goal to work toward during each repetition. After each set of 10 deep breaths, practice coughing to be sure your lungs are clear. If you have an incision (the cut made at the time of surgery), support your incision when coughing by placing a pillow or rolled up towels firmly against it. Once you are able to get out of bed, walk around indoors and cough well. You may stop using the incentive spirometer when instructed by your caregiver.  RISKS AND COMPLICATIONS Take your time so you do not  get dizzy or light-headed. If you are in pain, you may need to take or  ask for pain medication before doing incentive spirometry. It is harder to take a deep breath if you are having pain. AFTER USE Rest and breathe slowly and easily. It can be helpful to keep track of a log of your progress. Your caregiver can provide you with a simple table to help with this. If you are using the spirometer at home, follow these instructions: [place] IF:  You are having difficultly using the spirometer. You have trouble using the spirometer as often as instructed. Your pain medication is not giving enough relief while using the spirometer. You develop fever of 100.5 F (38.1 C) or higher. SEEK IMMEDIATE MEDICAL CARE IF:  You cough up bloody sputum that had not been present before. You develop fever of 102 F (38.9 C) or greater. You develop worsening pain at or near the incision site. MAKE SURE YOU:  Understand these instructions. Will watch your condition. Will get help right away if you are not doing well or get worse. Document Released: 03/17/2007 Document Revised: 01/27/2012 Document Reviewed: 05/18/2007 [facility] Patient Information 2014 [place], [place].   ________________________________________________________________________

## 2022-08-20 ENCOUNTER — Other Ambulatory Visit: Payer: Self-pay

## 2022-08-20 ENCOUNTER — Encounter (HOSPITAL_COMMUNITY): Payer: Self-pay

## 2022-08-20 ENCOUNTER — Encounter (HOSPITAL_COMMUNITY)
Admission: RE | Admit: 2022-08-20 | Discharge: 2022-08-20 | Disposition: A | Discharge status: Home / Self Care | Payer: Medicare Other | Source: Ambulatory Visit | Attending: Orthopedic Surgery | Admitting: Orthopedic Surgery

## 2022-08-20 VITALS — BP 158/83 | HR 70 | Temp 97.9°F | Ht 69.0 in | Wt 248.0 lb

## 2022-08-20 DIAGNOSIS — Z01812 Encounter for preprocedural laboratory examination: Secondary | ICD-10-CM | POA: Insufficient documentation

## 2022-08-20 DIAGNOSIS — R7303 Prediabetes: Secondary | ICD-10-CM | POA: Diagnosis not present

## 2022-08-20 DIAGNOSIS — Z01818 Encounter for other preprocedural examination: Secondary | ICD-10-CM

## 2022-08-20 HISTORY — DX: Peripheral vascular disease, unspecified: I73.9

## 2022-08-20 HISTORY — DX: Depression, unspecified: F32.A

## 2022-08-20 HISTORY — DX: Pneumonia, unspecified organism: J18.9

## 2022-08-20 HISTORY — DX: Prediabetes: R73.03

## 2022-08-20 HISTORY — DX: Dyspnea, unspecified: R06.00

## 2022-08-20 HISTORY — DX: Unspecified osteoarthritis, unspecified site: M19.90

## 2022-08-20 LAB — HEMOGLOBIN A1C
Hgb A1c MFr Bld: 6 % — ABNORMAL HIGH (ref 4.8–5.6)
Mean Plasma Glucose: 125.5 mg/dL

## 2022-08-20 LAB — SURGICAL PCR SCREEN
MRSA, PCR: NEGATIVE
Staphylococcus aureus: NEGATIVE

## 2022-08-20 LAB — GLUCOSE, CAPILLARY: Glucose-Capillary: 127 mg/dL — ABNORMAL HIGH (ref 70–99)

## 2022-08-20 NOTE — Progress Notes (Addendum)
For Short Stay: [place] appointment date: Date of COVID positive in last 94 days:  Bowel Prep reminder:   For Anesthesia: PCP - Leward Quan: PAC: Clearance: 07/12/22 Cardiologist -   Chest x-ray -  EKG - 07/12/22 : CEW requested/ Chart Stress Test -  ECHO -  Cardiac Cath -  Pacemaker/ICD device last checked: Pacemaker orders received: Device Rep notified:  Spinal Cord Stimulator:  Sleep Study - Yes CPAP - Yes  Fasting Blood Sugar -  Checks Blood Sugar _____ times a day Date and result of last Hgb A1c-  Blood Thinner Instructions: Xarelto: NO instructions yet.Pt. will call MD for instructions Aspirin Instructions: Last Dose:  Activity level: Can go up a flight of stairs and activities of daily living without stopping and without chest pain and/or shortness of breath   Able to exercise without chest pain and/or shortness of breath   Unable to go up a flight of stairs without chest pain and/or shortness of breath     Anesthesia review: Hx: HTN,OSA(CPAP),pre-DIA  Patient denies shortness of breath, fever, cough and chest pain at PAT appointment   Patient verbalized understanding of instructions that were given to them at the PAT appointment. Patient was also instructed that they will need to review over the PAT instructions again at home before surgery.

## 2022-09-02 ENCOUNTER — Encounter (HOSPITAL_COMMUNITY): Payer: Self-pay | Admitting: Orthopedic Surgery

## 2022-09-02 ENCOUNTER — Other Ambulatory Visit: Payer: Self-pay

## 2022-09-02 ENCOUNTER — Ambulatory Visit (HOSPITAL_COMMUNITY): Payer: Medicare Other | Admitting: Physician Assistant

## 2022-09-02 ENCOUNTER — Encounter (HOSPITAL_COMMUNITY)
Admission: RE | Disposition: A | Discharge status: Home / Self Care | Payer: Self-pay | Source: Ambulatory Visit | Attending: Orthopedic Surgery

## 2022-09-02 ENCOUNTER — Observation Stay (HOSPITAL_COMMUNITY)
Admission: RE | Admit: 2022-09-02 | Discharge: 2022-09-06 | Disposition: A | Discharge status: Home / Self Care | Payer: Medicare Other | Source: Ambulatory Visit | Attending: Orthopedic Surgery | Admitting: Orthopedic Surgery

## 2022-09-02 ENCOUNTER — Ambulatory Visit (HOSPITAL_BASED_OUTPATIENT_CLINIC_OR_DEPARTMENT_OTHER): Payer: Medicare Other | Admitting: Certified Registered Nurse Anesthetist

## 2022-09-02 DIAGNOSIS — G4733 Obstructive sleep apnea (adult) (pediatric): Secondary | ICD-10-CM | POA: Diagnosis not present

## 2022-09-02 DIAGNOSIS — Z9989 Dependence on other enabling machines and devices: Secondary | ICD-10-CM | POA: Diagnosis not present

## 2022-09-02 DIAGNOSIS — I10 Essential (primary) hypertension: Secondary | ICD-10-CM

## 2022-09-02 DIAGNOSIS — Z7982 Long term (current) use of aspirin: Secondary | ICD-10-CM | POA: Insufficient documentation

## 2022-09-02 DIAGNOSIS — M1712 Unilateral primary osteoarthritis, left knee: Secondary | ICD-10-CM

## 2022-09-02 DIAGNOSIS — Z79899 Other long term (current) drug therapy: Secondary | ICD-10-CM | POA: Insufficient documentation

## 2022-09-02 DIAGNOSIS — M179 Osteoarthritis of knee, unspecified: Secondary | ICD-10-CM | POA: Diagnosis present

## 2022-09-02 HISTORY — PX: TOTAL KNEE ARTHROPLASTY: SHX125

## 2022-09-02 SURGERY — ARTHROPLASTY, KNEE, TOTAL
Anesthesia: Monitor Anesthesia Care | Site: Knee | Laterality: Left

## 2022-09-02 MED ORDER — HYDROMORPHONE HCL 1 MG/ML IJ SOLN
0.5000 mg | INTRAMUSCULAR | Status: DC | PRN
  Administered 2022-09-02 – 2022-09-05 (×5): 1 mg via INTRAVENOUS
  Filled 2022-09-02 (×5): qty 1

## 2022-09-02 MED ORDER — FLEET ENEMA 7-19 GM/118ML RE ENEM: 1.0000 | ENEMA | Freq: Once | RECTAL | Status: DC | PRN

## 2022-09-02 MED ORDER — ONDANSETRON HCL 4 MG/2ML IJ SOLN
4.0000 mg | Freq: Four times a day (QID) | INTRAMUSCULAR | Status: DC | PRN
  Administered 2022-09-05: 4 mg via INTRAVENOUS
  Filled 2022-09-02: qty 2

## 2022-09-02 MED ORDER — BUPIVACAINE LIPOSOME 1.3 % IJ SUSP: 20.0000 mL | Freq: Once | INTRAMUSCULAR | Status: DC

## 2022-09-02 MED ORDER — TRANEXAMIC ACID 650 MG PO TABS (ORTHO)
1950.0000 mg | ORAL_TABLET | ORAL | Status: DC
Start: 2022-09-03 — End: 2022-09-02

## 2022-09-02 MED ORDER — OXYCODONE HCL 5 MG/5ML PO SOLN: 5.0000 mg | Freq: Once | ORAL | Status: DC | PRN

## 2022-09-02 MED ORDER — DEXAMETHASONE SODIUM PHOSPHATE 10 MG/ML IJ SOLN
8.0000 mg | Freq: Once | INTRAMUSCULAR | Status: AC
  Administered 2022-09-02: 8 mg via INTRAVENOUS

## 2022-09-02 MED ORDER — BUPROPION HCL ER (XL) 300 MG PO TB24
300.0000 mg | ORAL_TABLET | Freq: Every day | ORAL | Status: DC
  Administered 2022-09-03 – 2022-09-06 (×4): 300 mg via ORAL
  Filled 2022-09-02 (×4): qty 1

## 2022-09-02 MED ORDER — PROPOFOL 500 MG/50ML IV EMUL
INTRAVENOUS | Status: DC | PRN
  Administered 2022-09-02: 75 ug/kg/min via INTRAVENOUS

## 2022-09-02 MED ORDER — SODIUM CHLORIDE 0.9 % IR SOLN
Status: DC | PRN
  Administered 2022-09-02 (×2): 1000 mL

## 2022-09-02 MED ORDER — FUROSEMIDE 20 MG PO TABS
20.0000 mg | ORAL_TABLET | Freq: Every day | ORAL | Status: DC
  Administered 2022-09-04 – 2022-09-06 (×3): 20 mg via ORAL
  Filled 2022-09-02 (×4): qty 1

## 2022-09-02 MED ORDER — LOSARTAN POTASSIUM 50 MG PO TABS
50.0000 mg | ORAL_TABLET | Freq: Every day | ORAL | Status: DC
  Administered 2022-09-03 – 2022-09-06 (×4): 50 mg via ORAL
  Filled 2022-09-02 (×4): qty 1

## 2022-09-02 MED ORDER — MEPERIDINE HCL 50 MG/ML IJ SOLN: 6.2500 mg | INTRAMUSCULAR | Status: DC | PRN

## 2022-09-02 MED ORDER — MENTHOL 3 MG MT LOZG: 1.0000 | LOZENGE | OROMUCOSAL | Status: DC | PRN

## 2022-09-02 MED ORDER — METOCLOPRAMIDE HCL 5 MG/ML IJ SOLN: 5.0000 mg | Freq: Three times a day (TID) | INTRAMUSCULAR | Status: DC | PRN

## 2022-09-02 MED ORDER — DEXAMETHASONE SODIUM PHOSPHATE 10 MG/ML IJ SOLN
10.0000 mg | Freq: Once | INTRAMUSCULAR | Status: AC
  Administered 2022-09-03: 10 mg via INTRAVENOUS
  Filled 2022-09-02: qty 1

## 2022-09-02 MED ORDER — GABAPENTIN 300 MG PO CAPS
300.0000 mg | ORAL_CAPSULE | Freq: Two times a day (BID) | ORAL | Status: DC
  Administered 2022-09-02 – 2022-09-06 (×8): 300 mg via ORAL
  Filled 2022-09-02 (×8): qty 1

## 2022-09-02 MED ORDER — FENTANYL CITRATE PF 50 MCG/ML IJ SOSY: 25.0000 ug | PREFILLED_SYRINGE | INTRAMUSCULAR | Status: DC | PRN

## 2022-09-02 MED ORDER — TRANEXAMIC ACID 1000 MG/10ML IV SOLN
INTRAVENOUS | Status: DC | PRN
  Administered 2022-09-02: 2000 mg via TOPICAL

## 2022-09-02 MED ORDER — CEFAZOLIN SODIUM-DEXTROSE 2-4 GM/100ML-% IV SOLN
2.0000 g | INTRAVENOUS | Status: AC
  Administered 2022-09-02: 2 g via INTRAVENOUS
  Filled 2022-09-02: qty 100

## 2022-09-02 MED ORDER — METOCLOPRAMIDE HCL 5 MG PO TABS: 5.0000 mg | ORAL_TABLET | Freq: Three times a day (TID) | ORAL | Status: DC | PRN

## 2022-09-02 MED ORDER — SENNA 8.6 MG PO TABS
2.0000 | ORAL_TABLET | Freq: Every day | ORAL | Status: DC
  Administered 2022-09-02 – 2022-09-05 (×4): 17.2 mg via ORAL
  Filled 2022-09-02 (×4): qty 2

## 2022-09-02 MED ORDER — POVIDONE-IODINE 10 % EX SWAB
2.0000 | Freq: Once | CUTANEOUS | Status: AC
  Administered 2022-09-02: 2 via TOPICAL

## 2022-09-02 MED ORDER — OXYCODONE HCL 5 MG PO TABS: 5.0000 mg | ORAL_TABLET | ORAL | Status: DC | PRN

## 2022-09-02 MED ORDER — ONDANSETRON HCL 4 MG PO TABS: 4.0000 mg | ORAL_TABLET | Freq: Four times a day (QID) | ORAL | Status: DC | PRN

## 2022-09-02 MED ORDER — PHENYLEPHRINE 80 MCG/ML (10ML) SYRINGE FOR IV PUSH (FOR BLOOD PRESSURE SUPPORT)
PREFILLED_SYRINGE | INTRAVENOUS | Status: AC
  Filled 2022-09-02: qty 10

## 2022-09-02 MED ORDER — FENTANYL CITRATE PF 50 MCG/ML IJ SOSY
50.0000 ug | PREFILLED_SYRINGE | INTRAMUSCULAR | Status: AC
  Administered 2022-09-02: 100 ug via INTRAVENOUS
  Filled 2022-09-02: qty 2

## 2022-09-02 MED ORDER — RIVAROXABAN 10 MG PO TABS
10.0000 mg | ORAL_TABLET | Freq: Every day | ORAL | Status: DC
  Administered 2022-09-03 – 2022-09-06 (×4): 10 mg via ORAL
  Filled 2022-09-02 (×4): qty 1

## 2022-09-02 MED ORDER — ROPIVACAINE HCL 7.5 MG/ML IJ SOLN
INTRAMUSCULAR | Status: DC | PRN
  Administered 2022-09-02: 20 mL via PERINEURAL

## 2022-09-02 MED ORDER — COLCHICINE 0.6 MG PO TABS
0.6000 mg | ORAL_TABLET | Freq: Every day | ORAL | Status: DC
  Administered 2022-09-03 – 2022-09-06 (×4): 0.6 mg via ORAL
  Filled 2022-09-02 (×4): qty 1

## 2022-09-02 MED ORDER — TRANEXAMIC ACID 1000 MG/10ML IV SOLN
2000.0000 mg | Freq: Once | INTRAVENOUS | Status: DC
  Filled 2022-09-02: qty 20

## 2022-09-02 MED ORDER — SODIUM CHLORIDE 0.9 % IV SOLN: INTRAVENOUS | Status: DC

## 2022-09-02 MED ORDER — ACETAMINOPHEN 10 MG/ML IV SOLN
1000.0000 mg | Freq: Four times a day (QID) | INTRAVENOUS | Status: DC
  Administered 2022-09-02: 1000 mg via INTRAVENOUS
  Filled 2022-09-02: qty 100

## 2022-09-02 MED ORDER — ACETAMINOPHEN 325 MG PO TABS: 325.0000 mg | ORAL_TABLET | ORAL | Status: DC | PRN

## 2022-09-02 MED ORDER — METHOCARBAMOL 500 MG IVPB - SIMPLE MED: 500.0000 mg | Freq: Four times a day (QID) | INTRAVENOUS | Status: DC | PRN

## 2022-09-02 MED ORDER — OMEPRAZOLE MAGNESIUM 20 MG PO TBEC: 20.0000 mg | DELAYED_RELEASE_TABLET | Freq: Every day | ORAL | Status: DC

## 2022-09-02 MED ORDER — ORAL CARE MOUTH RINSE: 15.0000 mL | Freq: Once | OROMUCOSAL | Status: AC

## 2022-09-02 MED ORDER — PROPOFOL 1000 MG/100ML IV EMUL
INTRAVENOUS | Status: AC
  Filled 2022-09-02: qty 100

## 2022-09-02 MED ORDER — POLYETHYLENE GLYCOL 3350 17 G PO PACK
17.0000 g | PACK | Freq: Every day | ORAL | Status: DC | PRN
  Administered 2022-09-05 – 2022-09-06 (×2): 17 g via ORAL
  Filled 2022-09-02 (×2): qty 1

## 2022-09-02 MED ORDER — ACETAMINOPHEN 160 MG/5ML PO SOLN: 325.0000 mg | ORAL | Status: DC | PRN

## 2022-09-02 MED ORDER — METHOCARBAMOL 500 MG PO TABS
500.0000 mg | ORAL_TABLET | Freq: Four times a day (QID) | ORAL | Status: DC | PRN
  Administered 2022-09-02 – 2022-09-06 (×5): 500 mg via ORAL
  Filled 2022-09-02 (×6): qty 1

## 2022-09-02 MED ORDER — NITROFURANTOIN MACROCRYSTAL 50 MG PO CAPS
100.0000 mg | ORAL_CAPSULE | Freq: Every day | ORAL | Status: DC
  Administered 2022-09-03 – 2022-09-05 (×3): 100 mg via ORAL
  Filled 2022-09-02: qty 2
  Filled 2022-09-02: qty 1
  Filled 2022-09-02 (×3): qty 2

## 2022-09-02 MED ORDER — SODIUM CHLORIDE (PF) 0.9 % IJ SOLN
INTRAMUSCULAR | Status: AC
  Filled 2022-09-02: qty 10

## 2022-09-02 MED ORDER — DOCUSATE SODIUM 100 MG PO CAPS
100.0000 mg | ORAL_CAPSULE | Freq: Two times a day (BID) | ORAL | Status: DC
  Administered 2022-09-02 – 2022-09-06 (×8): 100 mg via ORAL
  Filled 2022-09-02 (×8): qty 1

## 2022-09-02 MED ORDER — DEXAMETHASONE SODIUM PHOSPHATE 10 MG/ML IJ SOLN
INTRAMUSCULAR | Status: DC | PRN
  Administered 2022-09-02: 10 mg

## 2022-09-02 MED ORDER — DIPHENHYDRAMINE HCL 12.5 MG/5ML PO ELIX
12.5000 mg | ORAL_SOLUTION | ORAL | Status: DC | PRN
  Administered 2022-09-02 – 2022-09-05 (×3): 25 mg via ORAL
  Filled 2022-09-02 (×3): qty 10

## 2022-09-02 MED ORDER — ONDANSETRON HCL 4 MG/2ML IJ SOLN
INTRAMUSCULAR | Status: AC
  Filled 2022-09-02: qty 2

## 2022-09-02 MED ORDER — ONDANSETRON HCL 4 MG/2ML IJ SOLN
INTRAMUSCULAR | Status: DC | PRN
  Administered 2022-09-02: 4 mg via INTRAVENOUS

## 2022-09-02 MED ORDER — TEMAZEPAM 15 MG PO CAPS
15.0000 mg | ORAL_CAPSULE | Freq: Every day | ORAL | Status: DC
  Administered 2022-09-02 – 2022-09-05 (×4): 15 mg via ORAL
  Filled 2022-09-02 (×4): qty 1

## 2022-09-02 MED ORDER — ONDANSETRON HCL 4 MG/2ML IJ SOLN: 4.0000 mg | Freq: Once | INTRAMUSCULAR | Status: DC | PRN

## 2022-09-02 MED ORDER — PHENOL 1.4 % MT LIQD: 1.0000 | OROMUCOSAL | Status: DC | PRN

## 2022-09-02 MED ORDER — BUPIVACAINE LIPOSOME 1.3 % IJ SUSP
INTRAMUSCULAR | Status: AC
  Filled 2022-09-02: qty 20

## 2022-09-02 MED ORDER — OXYCODONE HCL 5 MG PO TABS: 5.0000 mg | ORAL_TABLET | Freq: Once | ORAL | Status: DC | PRN

## 2022-09-02 MED ORDER — OXYCODONE HCL 5 MG PO TABS
10.0000 mg | ORAL_TABLET | ORAL | Status: DC | PRN
  Administered 2022-09-02 (×3): 10 mg via ORAL
  Administered 2022-09-03 – 2022-09-04 (×4): 15 mg via ORAL
  Administered 2022-09-05: 10 mg via ORAL
  Administered 2022-09-05: 15 mg via ORAL
  Administered 2022-09-05: 10 mg via ORAL
  Administered 2022-09-06: 15 mg via ORAL
  Filled 2022-09-02: qty 2
  Filled 2022-09-02 (×4): qty 3
  Filled 2022-09-02: qty 2
  Filled 2022-09-02 (×2): qty 3
  Filled 2022-09-02 (×3): qty 2
  Filled 2022-09-02 (×2): qty 3

## 2022-09-02 MED ORDER — PHENYLEPHRINE 80 MCG/ML (10ML) SYRINGE FOR IV PUSH (FOR BLOOD PRESSURE SUPPORT)
PREFILLED_SYRINGE | INTRAVENOUS | Status: DC | PRN
  Administered 2022-09-02: 80 ug via INTRAVENOUS

## 2022-09-02 MED ORDER — ACETAMINOPHEN 500 MG PO TABS
1000.0000 mg | ORAL_TABLET | Freq: Four times a day (QID) | ORAL | Status: AC
  Administered 2022-09-02 – 2022-09-03 (×3): 1000 mg via ORAL
  Filled 2022-09-02 (×3): qty 2

## 2022-09-02 MED ORDER — DEXAMETHASONE SODIUM PHOSPHATE 10 MG/ML IJ SOLN
INTRAMUSCULAR | Status: AC
  Filled 2022-09-02: qty 1

## 2022-09-02 MED ORDER — SODIUM CHLORIDE (PF) 0.9 % IJ SOLN
INTRAMUSCULAR | Status: AC
  Filled 2022-09-02: qty 50

## 2022-09-02 MED ORDER — CEFAZOLIN SODIUM-DEXTROSE 2-4 GM/100ML-% IV SOLN
2.0000 g | Freq: Four times a day (QID) | INTRAVENOUS | Status: DC
  Administered 2022-09-02: 2 g via INTRAVENOUS
  Filled 2022-09-02: qty 100

## 2022-09-02 MED ORDER — LACTATED RINGERS IV SOLN
INTRAVENOUS | Status: DC
Start: 2022-09-02 — End: 2022-09-02

## 2022-09-02 MED ORDER — TAMSULOSIN HCL 0.4 MG PO CAPS
0.4000 mg | ORAL_CAPSULE | Freq: Every day | ORAL | Status: DC
  Administered 2022-09-02 – 2022-09-05 (×4): 0.4 mg via ORAL
  Filled 2022-09-02 (×4): qty 1

## 2022-09-02 MED ORDER — SODIUM CHLORIDE (PF) 0.9 % IJ SOLN
INTRAMUSCULAR | Status: DC | PRN
  Administered 2022-09-02: 60 mL

## 2022-09-02 MED ORDER — BUPIVACAINE IN DEXTROSE 0.75-8.25 % IT SOLN
INTRATHECAL | Status: DC | PRN
  Administered 2022-09-02: 1.8 mL via INTRATHECAL

## 2022-09-02 MED ORDER — PROPOFOL 10 MG/ML IV BOLUS
INTRAVENOUS | Status: DC | PRN
  Administered 2022-09-02: 30 mg via INTRAVENOUS

## 2022-09-02 MED ORDER — CHLORHEXIDINE GLUCONATE 0.12 % MT SOLN
15.0000 mL | Freq: Once | OROMUCOSAL | Status: AC
  Administered 2022-09-02: 15 mL via OROMUCOSAL

## 2022-09-02 MED ORDER — BISACODYL 10 MG RE SUPP: 10.0000 mg | Freq: Every day | RECTAL | Status: DC | PRN

## 2022-09-02 MED ORDER — PANTOPRAZOLE SODIUM 40 MG PO TBEC
40.0000 mg | DELAYED_RELEASE_TABLET | Freq: Every day | ORAL | Status: DC
  Administered 2022-09-02 – 2022-09-05 (×4): 40 mg via ORAL
  Filled 2022-09-02 (×4): qty 1

## 2022-09-02 MED ORDER — BUPIVACAINE LIPOSOME 1.3 % IJ SUSP
INTRAMUSCULAR | Status: DC | PRN
  Administered 2022-09-02: 20 mL

## 2022-09-02 MED ORDER — LACTATED RINGERS IV SOLN: INTRAVENOUS | Status: DC

## 2022-09-02 SURGICAL SUPPLY — 54 items
ATTUNE MED DOME PAT 41 KNEE (Knees) IMPLANT
ATTUNE PS FEM LT SZ 8 CEM KNEE (Femur) IMPLANT
ATTUNE PSRP INSR SZ8 7 KNEE (Insert) IMPLANT
BAG COUNTER SPONGE SURGICOUNT (BAG) IMPLANT
BAG DECANTER FOR FLEXI CONT (MISCELLANEOUS) IMPLANT
BAG ZIPLOCK 12X15 (MISCELLANEOUS) ×1 IMPLANT
BASE TIBIAL ROT PLAT SZ 8 KNEE (Knees) IMPLANT
BLADE SAG 18X100X1.27 (BLADE) ×1 IMPLANT
BLADE SAW SGTL 11.0X1.19X90.0M (BLADE) ×1 IMPLANT
BNDG ELASTIC 6X5.8 VLCR STR LF (GAUZE/BANDAGES/DRESSINGS) ×1 IMPLANT
BOWL SMART MIX CTS (DISPOSABLE) ×1 IMPLANT
CEMENT HV SMART SET (Cement) ×2 IMPLANT
COVER SURGICAL LIGHT HANDLE (MISCELLANEOUS) ×1 IMPLANT
CUFF TOURN SGL QUICK 34 (TOURNIQUET CUFF) ×1
CUFF TRNQT CYL 34X4.125X (TOURNIQUET CUFF) ×1 IMPLANT
DRAPE INCISE IOBAN 66X45 STRL (DRAPES) ×1 IMPLANT
DRAPE U-SHAPE 47X51 STRL (DRAPES) ×1 IMPLANT
DRSG AQUACEL AG ADV 3.5X10 (GAUZE/BANDAGES/DRESSINGS) ×1 IMPLANT
DURAPREP 26ML APPLICATOR (WOUND CARE) ×1 IMPLANT
ELECT REM PT RETURN 15FT ADLT (MISCELLANEOUS) ×1 IMPLANT
GLOVE BIO SURGEON STRL SZ 6.5 (GLOVE) IMPLANT
GLOVE BIO SURGEON STRL SZ7.5 (GLOVE) IMPLANT
GLOVE BIO SURGEON STRL SZ8 (GLOVE) ×1 IMPLANT
GLOVE BIOGEL PI IND STRL 6.5 (GLOVE) IMPLANT
GLOVE BIOGEL PI IND STRL 7.0 (GLOVE) IMPLANT
GLOVE BIOGEL PI IND STRL 8 (GLOVE) ×1 IMPLANT
GOWN STRL REUS W/ TWL LRG LVL3 (GOWN DISPOSABLE) ×1 IMPLANT
GOWN STRL REUS W/ TWL XL LVL3 (GOWN DISPOSABLE) IMPLANT
GOWN STRL REUS W/TWL LRG LVL3 (GOWN DISPOSABLE) ×1
GOWN STRL REUS W/TWL XL LVL3 (GOWN DISPOSABLE)
HANDPIECE INTERPULSE COAX TIP (DISPOSABLE) ×1
HOLDER FOLEY CATH W/STRAP (MISCELLANEOUS) IMPLANT
IMMOBILIZER KNEE 20 (SOFTGOODS) ×1
IMMOBILIZER KNEE 20 THIGH 36 (SOFTGOODS) ×1 IMPLANT
KIT TURNOVER KIT A (KITS) IMPLANT
MANIFOLD NEPTUNE II (INSTRUMENTS) ×1 IMPLANT
NS IRRIG 1000ML POUR BTL (IV SOLUTION) ×1 IMPLANT
PACK TOTAL KNEE CUSTOM (KITS) ×1 IMPLANT
PADDING CAST COTTON 6X4 STRL (CAST SUPPLIES) ×2 IMPLANT
PROTECTOR NERVE ULNAR (MISCELLANEOUS) ×1 IMPLANT
SET HNDPC FAN SPRY TIP SCT (DISPOSABLE) ×1 IMPLANT
SPIKE FLUID TRANSFER (MISCELLANEOUS) ×1 IMPLANT
STRIP CLOSURE SKIN 1/2X4 (GAUZE/BANDAGES/DRESSINGS) ×2 IMPLANT
SUT MNCRL AB 4-0 PS2 18 (SUTURE) ×1 IMPLANT
SUT STRATAFIX 0 PDS 27 VIOLET (SUTURE) ×2
SUT VIC AB 2-0 CT1 27 (SUTURE) ×3
SUT VIC AB 2-0 CT1 TAPERPNT 27 (SUTURE) ×3 IMPLANT
SUTURE STRATFX 0 PDS 27 VIOLET (SUTURE) ×1 IMPLANT
SYR 50ML LL SCALE MARK (SYRINGE) IMPLANT
TIBIAL BASE ROT PLAT SZ 8 KNEE (Knees) ×1 IMPLANT
TRAY FOLEY MTR SLVR 16FR STAT (SET/KITS/TRAYS/PACK) ×1 IMPLANT
TUBE SUCTION HIGH CAP CLEAR NV (SUCTIONS) ×1 IMPLANT
WATER STERILE IRR 1000ML POUR (IV SOLUTION) ×2 IMPLANT
WRAP KNEE MAXI GEL POST OP (GAUZE/BANDAGES/DRESSINGS) ×1 IMPLANT

## 2022-09-02 NOTE — Progress Notes (Signed)
PT Note  Patient Details Name: Nathan Moran MRN: [id number] DOB: 1946/02/27     Eval completed , full write up to follow.  Pt required ModA of 2 people for supine to sit and sit to supine. Performed sit to stand 2x at bedside with 2 person assist for safety . Side steps to head of bed was all we could perform today and make sure Bil LEs were not buckling. KI on LLE , however RLE not strong.   Pt was not very ambulatory prior to surgery. Reports for about 10 months he has relied on his WC in the home and out and about. The only steps he performs is from side of his truck to put his WC in the back , with RW to the driver seat. He did have a fall yesterday prior to coming into to surgery. The LLE gives way and he has very little strength in the R LE due to "neuropathy" pt states.    [name] 09/02/2022, 7:20 PM Gatha Mayer, PT, MPT Acute Rehabilitation Services Office: [phone] If a weekend: [facility] w/e pager [phone] 09/02/2022

## 2022-09-02 NOTE — Interval H&P Note (Signed)
History and Physical Interval Note:  09/02/2022 9:26 AM  Nathan Moran  has presented today for surgery, with the diagnosis of left knee osteoarthritis.  The various methods of treatment have been discussed with the patient and family. After consideration of risks, benefits and other options for treatment, the patient has consented to  Procedure(s): TOTAL KNEE ARTHROPLASTY (Left) as a surgical intervention.  The patient's history has been reviewed, patient examined, no change in status, stable for surgery.  I have reviewed the patient's chart and labs.  Questions were answered to the patient's satisfaction.     Nathan Moran

## 2022-09-02 NOTE — Anesthesia Procedure Notes (Signed)
Procedure Name: MAC Date/Time: 09/02/2022 10:32 AM  Performed by: Claudia Desanctis, CRNAPre-anesthesia Checklist: Patient identified, Emergency Drugs available, Suction available and Patient being monitored Patient Re-evaluated:Patient Re-evaluated prior to induction Oxygen Delivery Method: Simple face mask

## 2022-09-02 NOTE — Transfer of Care (Signed)
Immediate Anesthesia Transfer of Care Note  Patient: Nathan Moran  Procedure(s) Performed: TOTAL KNEE ARTHROPLASTY (Left: Knee)  Patient Location: PACU  Anesthesia Type:Spinal  Level of Consciousness: awake and patient cooperative  Airway & Oxygen Therapy: Patient Spontanous Breathing and Patient connected to face mask  Post-op Assessment: Report given to RN and Post -op Vital signs reviewed and stable  Post vital signs: Reviewed and stable  Last Vitals:  Vitals Value Taken Time  BP 130/71 09/02/22 1223  Temp    Pulse 74 09/02/22 1224  Resp 20 09/02/22 1224  SpO2 98 % 09/02/22 1224  Vitals shown include unvalidated device data.  Last Pain:  Vitals:   09/02/22 0938  TempSrc:   PainSc: 0-No pain      Patients Stated Pain Goal: 4 (88/11/03 1594)  Complications: No notable events documented.

## 2022-09-02 NOTE — Evaluation (Signed)
Physical Therapy Evaluation Patient Details Name: Nathan Moran MRN: [id number] DOB: 1946/09/13 Today's Date: 09/02/2022  History of Present Illness  76 y/o male admit s/p LTKA 09/02/2022  with PMH of neuropathy ( pt reports) , arthiritis, GERD,HTN, h/o BLE DVT, s/p L1-4 laminectomy.     Clinical Impression   Pt with limited mobility prior to surgery (challenging historian). Sounds like for about 10 months has heavily relied on his WC for mobility, has a lift chair, and ramp. He walks with RW from trunk to driver side, however has had several falls, one being yesterday prior to his surgery today. Pt's and wife's goal is to walk again. We will work on safe ambulation goals, however if goal is to DC home, he may have to use the [facility] for safe mobility as he transitions and gets stronger with more rehab. Feel he would benefit from [facility] rehab, however pt and wife wanting to be able to DC home.  Just making sure they have realistic goals for what BLE weakness was assessed today. Will continue to follow while on acute to help with safe DC plan.         Recommendations for follow up therapy are one component of a multi-disciplinary discharge planning process, led by the attending physician.  Recommendations may be updated based on patient status, additional functional criteria and insurance authorization.  Follow Up Recommendations Follow physician's recommendations for discharge plan and follow up therapies (recommend ST-SNF)      Assistance Recommended at Discharge Frequent or constant Supervision/Assistance  Patient can return home with the following  A lot of help with walking and/or transfers;A lot of help with bathing/dressing/bathroom;Assist for transportation;Help with stairs or ramp for entrance    Equipment Recommendations None recommended by PT (pt has equipment but may need a 3n1, will continue to assess)  Recommendations for Other Services       Functional Status Assessment  Patient has had a recent decline in their functional status and demonstrates the ability to make significant improvements in function in a reasonable and predictable amount of time.     Precautions / Restrictions Precautions Precautions: Knee Precaution Comments: educated pt and pt's wife with no knee flexion while supine or elveated, no pillows under knee and showed them how to roll towel lateral side and hook small towel roll under ankle area to help with knee extension and prevent ER and flexion when resting LLE. Required Braces or Orthoses: Knee Immobilizer - Left Knee Immobilizer - Left: Discontinue once straight leg raise with < 10 degree lag;On when out of bed or walking Restrictions Weight Bearing Restrictions: No      Mobility  Bed Mobility Overal bed mobility: Needs Assistance Bed Mobility: Supine to Sit, Sit to Supine     Supine to sit: +2 for physical assistance Sit to supine: +2 for physical assistance   General bed mobility comments: required assist with BLEs and upper body as well as scooting to the edge of the bed . Pt is very weak in BLEs and UEs as well.    Transfers Overall transfer level: Needs assistance Equipment used: Rolling walker (2 wheels) Transfers: Sit to/from Stand Sit to Stand: Mod assist, +2 physical assistance           General transfer comment: boost for elevated bed and to clear hips to stand. Was able to stand fairly upright once in standing.    Ambulation/Gait Ambulation/Gait assistance: Mod assist, +2 physical assistance, +2 safety/equipment Gait Distance (Feet): 2 Feet  Assistive device: Rolling walker (2 wheels)         General Gait Details: just attempted side stepping to the head of the bed 2xs for safety and activity tolernace reasons tonight.  Stairs            Wheelchair Mobility    Modified Rankin (Stroke Patients Only)       Balance Overall balance assessment: Needs assistance Sitting-balance support: No  upper extremity supported, Bilateral upper extremity supported Sitting balance-Leahy Scale: Fair     Standing balance support: Bilateral upper extremity supported, During functional activity Standing balance-Leahy Scale: Poor                               Pertinent Vitals/Pain Pain Assessment Pain Assessment: 0-10 Pain Score: 4  Pain Location: L knee and posterior knee when I was working on improving the positioning and knee extension. Pain Descriptors / Indicators: Sore, Shooting Pain Intervention(s): Limited activity within patient's tolerance, Monitored during session    Home Living Family/patient expects to be discharged to:: Private residence Living Arrangements: Spouse/significant other Available Help at Discharge: Family Type of Home: House Home Access: Ramped entrance       Home Layout: One level Home Equipment: Agricultural consultant (2 wheels);Shower seat;Hand held shower head;Adaptive equipment Additional Comments: pt has lift chair as well.    Prior Function Prior Level of Function : Needs assist;History of Falls (last six months)       Physical Assist : Mobility (physical);ADLs (physical) Mobility (physical): Gait;Transfers   Mobility Comments: Pt was relying a lot on his WC requiring assist with some mobility and history of falls as recent as yesterday ( 09/01/2022 ) beofre coming into surgery. Difficult to get pt to full describe prior level of function clearly.       Hand Dominance        Extremity/Trunk Assessment        Lower Extremity Assessment Lower Extremity Assessment: RLE deficits/detail;LLE deficits/detail RLE Deficits / Details: 3/5 PF, 5/5 DF, 2/5 hip flexion, 3/5 knee extension. unable to move R LE on and off bed with simple ADduction or abduction pattern, required full assist for RLE as well as LLE. RLE Sensation: WNL RLE Coordination: WNL LLE Deficits / Details: full knee extension lacking about 10 degrees, able to form knee  extesnion/ quad contraction but still 3-/5 for knee extension, 2/5 PF, 5/5 DF, hip flexion 3/5 LLE Sensation: WNL       Communication   Communication: No difficulties  Cognition Arousal/Alertness: Awake/alert Behavior During Therapy: WFL for tasks assessed/performed Overall Cognitive Status: Within Functional Limits for tasks assessed                                          General Comments      Exercises Total Joint Exercises Ankle Circles/Pumps: AROM, Supine, Both, 10 reps Quad Sets: Left, Supine, AROM, 5 reps Heel Slides: Supine, Left, 5 reps, AAROM Straight Leg Raises: AAROM, Supine, Left, 5 reps Goniometric ROM: 10-50   Assessment/Plan    PT Assessment Patient needs continued PT services  PT Problem List Decreased strength;Decreased range of motion;Decreased activity tolerance;Decreased mobility       PT Treatment Interventions Gait training;Functional mobility training;Therapeutic activities;Therapeutic exercise;Patient/family education    PT Goals (Current goals can be found in the Care Plan section)  Acute Rehab  PT Goals Patient Stated Goal: I want to be able to walk again PT Goal Formulation: With patient Time For Goal Achievement: 09/16/22 Potential to Achieve Goals: Good    Frequency 7X/week     Co-evaluation               AM-PAC PT "6 Clicks" Mobility  Outcome Measure Help needed turning from your back to your side while in a flat bed without using bedrails?: A Lot Help needed moving from lying on your back to sitting on the side of a flat bed without using bedrails?: A Lot Help needed moving to and from a bed to a chair (including a wheelchair)?: A Lot Help needed standing up from a chair using your arms (e.g., wheelchair or bedside chair)?: A Lot Help needed to walk in hospital room?: Total Help needed climbing 3-5 steps with a railing? : Total 6 Click Score: 10    End of Session Equipment Utilized During Treatment: Gait  belt Activity Tolerance: Patient tolerated treatment well Patient left: in bed;with family/visitor present;with call bell/phone within reach Nurse Communication: Mobility status (will need +2 for any transfers and KI on LLE when mobilizing.) PT Visit Diagnosis: Unsteadiness on feet (R26.81);Other abnormalities of gait and mobility (R26.89);Muscle weakness (generalized) (M62.81);History of falling (Z91.81)    Time: 1749-4496 PT Time Calculation (min) (ACUTE ONLY): 38 min   Charges:   PT Evaluation $PT Eval Moderate Complexity: 1 Mod PT Treatments $Gait Training: 8-22 mins $Therapeutic Exercise: 8-22 mins        Clois Dupes, PT, MPT Acute Rehabilitation Services Office: [phone] If a weekend: [facility] w/e pager [phone] 09/02/2022   Marella Bile 09/02/2022, 8:45 PM

## 2022-09-02 NOTE — Progress Notes (Signed)
Orthopedic Tech Progress Note Patient Details:  Nathan Moran 06/22/1946 [id number]  CPM Left Knee CPM Left Knee: Off Left Knee Flexion (Degrees): 40 Left Knee Extension (Degrees): 10 Additional Comments: left  Post Interventions Patient Tolerated: Well Instructions Provided: Care of device, Adjustment of device  [name] 09/02/2022, 2:46 PM

## 2022-09-02 NOTE — Anesthesia Procedure Notes (Signed)
Spinal  Patient location during procedure: OR Start time: 09/02/2022 10:24 AM End time: 09/02/2022 10:27 AM Reason for block: surgical anesthesia Staffing Anesthesiologist: Janeece Riggers, MD Performed by: Janeece Riggers, MD Authorized by: Janeece Riggers, MD   Preanesthetic Checklist Completed: patient identified, IV checked, site marked, risks and benefits discussed, surgical consent, monitors and equipment checked, pre-op evaluation and timeout performed Spinal Block Patient position: sitting Prep: DuraPrep Patient monitoring: heart rate, cardiac monitor, continuous pulse ox and blood pressure Approach: midline Location: L3-4 Injection technique: single-shot Needle Needle type: Sprotte  Needle gauge: 24 G Needle length: 9 cm Assessment Sensory level: T4 Events: CSF return

## 2022-09-02 NOTE — Plan of Care (Signed)
  Problem: Education: Goal: Knowledge of the prescribed therapeutic regimen will improve Outcome: Progressing   Problem: Pain Management: Goal: Pain level will decrease with appropriate interventions Outcome: Progressing   Problem: Activity: Goal: Ability to avoid complications of mobility impairment will improve Outcome: Progressing   

## 2022-09-02 NOTE — Op Note (Signed)
OPERATIVE REPORT-TOTAL KNEE ARTHROPLASTY   Pre-operative diagnosis- Osteoarthritis  Left knee(s)  Post-operative diagnosis- Osteoarthritis Left knee(s)  Procedure-  Left  Total Knee Arthroplasty  Surgeon- Dione Plover. Shamariah Shewmake, MD  Assistant- Theresa Duty, PA-C   Anesthesia-   Adductor canal block and spinal  EBL-100 mL   Drains None  Tourniquet time-  Total Tourniquet Time Documented: Thigh (Left) - 58 minutes Total: Thigh (Left) - 58 minutes     Complications- None  Condition-PACU - hemodynamically stable.   Brief Clinical Note   Nathan Moran is a 76 y.o. year old male with end stage OA of his left knee with progressively worsening pain and dysfunction. He has constant pain, with activity and at rest and significant functional deficits with difficulties even with ADLs. He has had extensive non-op management including analgesics, injections of cortisone and viscosupplements, and home exercise program, but remains in significant pain with significant dysfunction. Radiographs show bone on bone arthritis medial and patellofemoral. He presents now for left Total Knee Arthroplasty.     Procedure in detail---   The patient is brought into the operating room and positioned supine on the operating table. After successful administration of  Adductor canal block and spinal,   a tourniquet is placed high on the  Left thigh(s) and the lower extremity is prepped and draped in the usual sterile fashion. Time out is performed by the operating team and then the  Left lower extremity is wrapped in Esmarch, knee flexed and the tourniquet inflated to 300 mmHg.       A midline incision is made with a ten blade through the subcutaneous tissue to the level of the extensor mechanism. A fresh blade is used to make a medial parapatellar arthrotomy. Soft tissue over the proximal medial tibia is subperiosteally elevated to the joint line with a knife and into the semimembranosus bursa with a Cobb  elevator. Soft tissue over the proximal lateral tibia is elevated with attention being paid to avoiding the patellar tendon on the tibial tubercle. The patella is everted, knee flexed 90 degrees and the ACL and PCL are removed. Findings are bone on bone medial and patellofemoral with massive global osteophytes        The drill is used to create a starting hole in the distal femur and the canal is thoroughly irrigated with sterile saline to remove the fatty contents. The 5 degree Left  valgus alignment guide is placed into the femoral canal and the distal femoral cutting block is pinned to remove 9 mm off the distal femur. Resection is made with an oscillating saw.      The tibia is subluxed forward and the menisci are removed. The extramedullary alignment guide is placed referencing proximally at the medial aspect of the tibial tubercle and distally along the second metatarsal axis and tibial crest. The block is pinned to remove 51mm off the more deficient medial  side. Resection is made with an oscillating saw. Size 8is the most appropriate size for the tibia and the proximal tibia is prepared with the modular drill and keel punch for that size.      The femoral sizing guide is placed and size 8 is most appropriate. Rotation is marked off the epicondylar axis and confirmed by creating a rectangular flexion gap at 90 degrees. The size 8 cutting block is pinned in this rotation and the anterior, posterior and chamfer cuts are made with the oscillating saw. The intercondylar block is then placed and that cut is  made.      Trial size 8 tibial component, trial size 8 posterior stabilized femur and a 7  mm posterior stabilized rotating platform insert trial is placed. Full extension is achieved with excellent varus/valgus and anterior/posterior balance throughout full range of motion. The patella is everted and thickness measured to be 27  mm. Free hand resection is taken to 15 mm, a 41 template is placed, lug holes  are drilled, trial patella is placed, and it tracks normally. Osteophytes are removed off the posterior femur with the trial in place. All trials are removed and the cut bone surfaces prepared with pulsatile lavage. Cement is mixed and once ready for implantation, the size 8 tibial implant, size  8 posterior stabilized femoral component, and the size 41 patella are cemented in place and the patella is held with the clamp. The trial insert is placed and the knee held in full extension. The Exparel (20 ml mixed with 60 ml saline) is injected into the extensor mechanism, posterior capsule, medial and lateral gutters and subcutaneous tissues.  All extruded cement is removed and once the cement is hard the permanent 7 mm posterior stabilized rotating platform insert is placed into the tibial tray.      The wound is copiously irrigated with saline solution and the extensor mechanism closed with # 0 Stratofix suture. The tourniquet is released for a total tourniquet time of 58  minutes. Flexion against gravity is 140 degrees and the patella tracks normally. Subcutaneous tissue is closed with 2.0 vicryl and subcuticular with running 4.0 Monocryl. The incision is cleaned and dried and steri-strips and a bulky sterile dressing are applied. The limb is placed into a knee immobilizer and the patient is awakened and transported to recovery in stable condition.      Please note that a surgical assistant was a medical necessity for this procedure in order to perform it in a safe and expeditious manner. Surgical assistant was necessary to retract the ligaments and vital neurovascular structures to prevent injury to them and also necessary for proper positioning of the limb to allow for anatomic placement of the prosthesis.   Dione Plover Torben Soloway, MD    09/02/2022, 11:55 AM

## 2022-09-02 NOTE — Plan of Care (Signed)
Plan of care reviewed and discussed with the patient. 

## 2022-09-02 NOTE — Anesthesia Preprocedure Evaluation (Addendum)
Anesthesia Evaluation  Patient identified by MRN, date of birth, ID band Patient awake    Reviewed: Allergy & Precautions, NPO status , Patient's Chart, lab work & pertinent test results  History of Anesthesia Complications Negative for: history of anesthetic complications  Airway Mallampati: II  TM Distance: >3 FB Neck ROM: Full    Dental  (+) Teeth Intact, Dental Advisory Given, Implants   Pulmonary sleep apnea and Continuous Positive Airway Pressure Ventilation ,    Pulmonary exam normal breath sounds clear to auscultation       Cardiovascular hypertension, Pt. on medications Normal cardiovascular exam Rhythm:Regular Rate:Normal     Neuro/Psych PSYCHIATRIC DISORDERS Anxiety Lumbar stenosis     GI/Hepatic Neg liver ROS, GERD  ,  Endo/Other  Morbid obesity  Renal/GU negative Renal ROS     Musculoskeletal  (+) Arthritis ,   Abdominal   Peds  Hematology negative hematology ROS (+)   Anesthesia Other Findings MRI 21' Alignment:  Mild convex left curvature is unchanged.  No listhesis.  Vertebrae: No fracture, evidence of discitis, or bone lesion. The patient has marked multilevel degenerative endplate signal change which is worst at T11-12, L1-2 and L2-3. 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  Reproductive/Obstetrics                            Anesthesia Physical  Anesthesia Plan  ASA: 3  Anesthesia Plan: MAC, Regional and Spinal   Post-op Pain Management:    Induction: Intravenous  PONV Risk Score and Plan: 3 and Dexamethasone, Ondansetron and Treatment may vary due to age or medical condition  Airway Management Planned: Natural Airway and Simple Face Mask  Additional Equipment: None  Intra-op Plan:   Post-operative Plan:   Informed Consent: I have reviewed the patients History and Physical, chart, labs and  discussed the procedure including the risks, benefits and alternatives for the proposed anesthesia with the patient or authorized representative who has indicated his/her understanding and acceptance.     Dental advisory given  Plan Discussed with: CRNA and Anesthesiologist  Anesthesia Plan Comments:         Anesthesia Quick Evaluation

## 2022-09-02 NOTE — Discharge Instructions (Signed)
 Frank Aluisio, MD Total Joint Specialist [facility] [address] [place], [place] [address] [phone]  TOTAL KNEE REPLACEMENT POSTOPERATIVE DIRECTIONS    Knee Rehabilitation, Guidelines Following Surgery  Results after knee surgery are often greatly improved when you follow the exercise, range of motion and muscle strengthening exercises prescribed by your doctor. Safety measures are also important to protect the knee from further injury. If any of these exercises cause you to have increased pain or swelling in your knee joint, decrease the amount until you are comfortable again and slowly increase them. If you have problems or questions, call your caregiver or physical therapist for advice.   HOME CARE INSTRUCTIONS  Remove items at home which could result in a fall. This includes throw rugs or furniture in walking pathways.  ICE to the affected knee as much as tolerated. Icing helps control swelling. If the swelling is well controlled you will be more comfortable and rehab easier. Continue to use ice on the knee for pain and swelling from surgery. You may notice swelling that will progress down to the foot and ankle. This is normal after surgery. Elevate the leg when you are not up walking on it.    Continue to use the breathing machine which will help keep your temperature down. It is common for your temperature to cycle up and down following surgery, especially at night when you are not up moving around and exerting yourself. The breathing machine keeps your lungs expanded and your temperature down. Do not place pillow under the operative knee, focus on keeping the knee straight while resting  DIET You may resume your previous home diet once you are discharged from the hospital.  DRESSING / WOUND CARE / SHOWERING Keep your bulky bandage on for 2 days. On the third post-operative day you may remove the Ace bandage and gauze. There is a waterproof  adhesive bandage on your skin which will stay in place until your first follow-up appointment. Once you remove this you will not need to place another bandage You may begin showering 3 days following surgery, but do not submerge the incision under water.  ACTIVITY For the first 5 days, the key is rest and control of pain and swelling Do your home exercises twice a day starting on post-operative day 3. On the days you go to physical therapy, just do the home exercises once that day. You should rest, ice and elevate the leg for 50 minutes out of every hour. Get up and walk/stretch for 10 minutes per hour. After 5 days you can increase your activity slowly as tolerated. Walk with your walker as instructed. Use the walker until you are comfortable transitioning to a cane. Walk with the cane in the opposite hand of the operative leg. You may discontinue the cane once you are comfortable and walking steadily. Avoid periods of inactivity such as sitting longer than an hour when not asleep. This helps prevent blood clots.  You may discontinue the knee immobilizer once you are able to perform a straight leg raise while lying down. You may resume a sexual relationship in one month or when given the OK by your doctor.  You may return to work once you are cleared by your doctor.  Do not drive a car for 6 weeks or until released by your surgeon.  Do not drive while taking narcotics.  TED HOSE STOCKINGS Wear the elastic stockings on both legs for three weeks following surgery during the   day. You may remove them at night for sleeping.  WEIGHT BEARING Weight bearing as tolerated with assist device (walker, cane, etc) as directed, use it as long as suggested by your surgeon or therapist, typically at least 4-6 weeks.  POSTOPERATIVE CONSTIPATION PROTOCOL Constipation - defined medically as fewer than three stools per week and severe constipation as less than one stool per week.  One of the most common issues  patients have following surgery is constipation.  Even if you have a regular bowel pattern at home, your normal regimen is likely to be disrupted due to multiple reasons following surgery.  Combination of anesthesia, postoperative narcotics, change in appetite and fluid intake all can affect your bowels.  In order to avoid complications following surgery, here are some recommendations in order to help you during your recovery period.  Colace (docusate) - Pick up an over-the-counter form of Colace or another stool softener and take twice a day as long as you are requiring postoperative pain medications.  Take with a full glass of water daily.  If you experience loose stools or diarrhea, hold the colace until you stool forms back up. If your symptoms do not get better within 1 week or if they get worse, check with your doctor. Dulcolax (bisacodyl) - Pick up over-the-counter and take as directed by the product packaging as needed to assist with the movement of your bowels.  Take with a full glass of water.  Use this product as needed if not relieved by Colace only.  MiraLax (polyethylene glycol) - Pick up over-the-counter to have on hand. MiraLax is a solution that will increase the amount of water in your bowels to assist with bowel movements.  Take as directed and can mix with a glass of water, juice, soda, coffee, or tea. Take if you go more than two days without a movement. Do not use MiraLax more than once per day. Call your doctor if you are still constipated or irregular after using this medication for 7 days in a row.  If you continue to have problems with postoperative constipation, please contact the office for further assistance and recommendations.  If you experience "the worst abdominal pain ever" or develop nausea or vomiting, please contact the office immediatly for further recommendations for treatment.  ITCHING If you experience itching with your medications, try taking only a single pain  pill, or even half a pain pill at a time.  You can also use Benadryl over the counter for itching or also to help with sleep.   MEDICATIONS See your medication summary on the "After Visit Summary" that the nursing staff will review with you prior to discharge.  You may have some home medications which will be placed on hold until you complete the course of blood thinner medication.  It is important for you to complete the blood thinner medication as prescribed by your surgeon.  Continue your approved medications as instructed at time of discharge.  PRECAUTIONS If you experience chest pain or shortness of breath - call 911 immediately for transfer to the hospital emergency department.  If you develop a fever greater that 101 F, purulent drainage from wound, increased redness or drainage from wound, foul odor from the wound/dressing, or calf pain - CONTACT YOUR SURGEON.                                                     FOLLOW-UP APPOINTMENTS Make sure you keep all of your appointments after your operation with your surgeon and caregivers. You should call the office at the above phone number and make an appointment for approximately two weeks after the date of your surgery or on the date instructed by your surgeon outlined in the "After Visit Summary".  RANGE OF MOTION AND STRENGTHENING EXERCISES  Rehabilitation of the knee is important following a knee injury or an operation. After just a few days of immobilization, the muscles of the thigh which control the knee become weakened and shrink (atrophy). Knee exercises are designed to build up the tone and strength of the thigh muscles and to improve knee motion. Often times heat used for twenty to thirty minutes before working out will loosen up your tissues and help with improving the range of motion but do not use heat for the first two weeks following surgery. These exercises can be done on a training (exercise) mat, on the floor, on a table or on a bed.  Use what ever works the best and is most comfortable for you Knee exercises include:  Leg Lifts - While your knee is still immobilized in a splint or cast, you can do straight leg raises. Lift the leg to 60 degrees, hold for 3 sec, and slowly lower the leg. Repeat 10-20 times 2-3 times daily. Perform this exercise against resistance later as your knee gets better.  Quad and Hamstring Sets - Tighten up the muscle on the front of the thigh (Quad) and hold for 5-10 sec. Repeat this 10-20 times hourly. Hamstring sets are done by pushing the foot backward against an object and holding for 5-10 sec. Repeat as with quad sets.  Leg Slides: Lying on your back, slowly slide your foot toward your buttocks, bending your knee up off the floor (only go as far as is comfortable). Then slowly slide your foot back down until your leg is flat on the floor again. Angel Wings: Lying on your back spread your legs to the side as far apart as you can without causing discomfort.  A rehabilitation program following serious knee injuries can speed recovery and prevent re-injury in the future due to weakened muscles. Contact your doctor or a physical therapist for more information on knee rehabilitation.   POST-OPERATIVE OPIOID TAPER INSTRUCTIONS: It is important to wean off of your opioid medication as soon as possible. If you do not need pain medication after your surgery it is ok to stop day one. Opioids include: Codeine, Hydrocodone(Norco, Vicodin), Oxycodone(Percocet, oxycontin) and hydromorphone amongst others.  Long term and even short term use of opiods can cause: Increased pain response Dependence Constipation Depression Respiratory depression And more.  Withdrawal symptoms can include Flu like symptoms Nausea, vomiting And more Techniques to manage these symptoms Hydrate well Eat regular healthy meals Stay active Use relaxation techniques(deep breathing, meditating, yoga) Do Not substitute Alcohol to help  with tapering If you have been on opioids for less than two weeks and do not have pain than it is ok to stop all together.  Plan to wean off of opioids This plan should start within one week post op of your joint replacement. Maintain the same interval or time between taking each dose and first decrease the dose.  Cut the total daily intake of opioids by one tablet each day Next start to increase the time between doses. The last dose that should be eliminated is the evening dose.   IF YOU ARE TRANSFERRED TO   A SKILLED REHAB FACILITY If the patient is transferred to a skilled rehab facility following release from the hospital, a list of the current medications will be sent to the facility for the patient to continue.  When discharged from the skilled rehab facility, please have the facility set up the patient's Home Health Physical Therapy prior to being released. Also, the skilled facility will be responsible for providing the patient with their medications at time of release from the facility to include their pain medication, the muscle relaxants, and their blood thinner medication. If the patient is still at the rehab facility at time of the two week follow up appointment, the skilled rehab facility will also need to assist the patient in arranging follow up appointment in our office and any transportation needs.  MAKE SURE YOU:  Understand these instructions.  Get help right away if you are not doing well or get worse.   DENTAL ANTIBIOTICS:  In most cases prophylactic antibiotics for Dental procdeures after total joint surgery are not necessary.  Exceptions are as follows:  1. History of prior total joint infection  2. Severely immunocompromised (Organ Transplant, cancer chemotherapy, Rheumatoid biologic medications such as Humera)  3. Poorly controlled diabetes (A1C &gt; 8.0, blood glucose over 200)  If you have one of these conditions, contact your surgeon for an antibiotic  prescription, prior to your dental procedure.    Pick up stool softner and laxative for home use following surgery while on pain medications. Do not submerge incision under water. Please use good hand washing techniques while changing dressing each day. May shower starting three days after surgery. Please use a clean towel to pat the incision dry following showers. Continue to use ice for pain and swelling after surgery. Do not use any lotions or creams on the incision until instructed by your surgeon.  

## 2022-09-02 NOTE — Anesthesia Procedure Notes (Signed)
Anesthesia Regional Block: Adductor canal block   Pre-Anesthetic Checklist: , timeout performed,  Correct Patient, Correct Site, Correct Laterality,  Correct Procedure, Correct Position, site marked,  Risks and benefits discussed,  Surgical consent,  Pre-op evaluation,  At surgeon's request and post-op pain management  Laterality: Left  Prep: chloraprep       Needles:  Injection technique: Single-shot  Needle Type: Echogenic Stimulator Needle     Needle Length: 5cm  Needle Gauge: 22     Additional Needles:   Procedures:,,,, ultrasound used (permanent image in chart),,    Narrative:  Start time: 09/02/2022 9:35 AM End time: 09/02/2022 9:41 AM Injection made incrementally with aspirations every 5 mL.  Performed by: Personally  Anesthesiologist: Janeece Riggers, MD  Additional Notes: Functioning IV was confirmed and monitors were applied.  A 47mm 22ga Arrow echogenic stimulator needle was used. Sterile prep and drape,hand hygiene and sterile gloves were used. Ultrasound guidance: relevant anatomy identified, needle position confirmed, local anesthetic spread visualized around nerve(s)., vascular puncture avoided.  Image printed for medical record. Negative aspiration and negative test dose prior to incremental administration of local anesthetic. The patient tolerated the procedure well.

## 2022-09-02 NOTE — Anesthesia Postprocedure Evaluation (Signed)
Anesthesia Post Note  Patient: Nathan Moran  Procedure(s) Performed: TOTAL KNEE ARTHROPLASTY (Left: Knee)     Patient location during evaluation: PACU Anesthesia Type: Regional, Spinal and MAC Level of consciousness: oriented and awake and alert Pain management: pain level controlled Vital Signs Assessment: post-procedure vital signs reviewed and stable Respiratory status: spontaneous breathing, respiratory function stable and patient connected to nasal cannula oxygen Cardiovascular status: blood pressure returned to baseline and stable Postop Assessment: no headache, no backache and no apparent nausea or vomiting Anesthetic complications: no   No notable events documented.  Last Vitals:  Vitals:   09/02/22 1315 09/02/22 1343  BP: 136/73 133/79  Pulse: 61 80  Resp: 16 16  Temp: 36.6 C (!) 36.4 C  SpO2: 98% 96%    Last Pain:  Vitals:   09/02/22 1430  TempSrc:   PainSc: 7                  Taleigh Gero

## 2022-09-02 NOTE — Progress Notes (Signed)
Orthopedic Tech Progress Note Patient Details:  Nathan Moran Nov 13, 1946 [id number]  CPM Left Knee CPM Left Knee: On Left Knee Flexion (Degrees): 40 Left Knee Extension (Degrees): 10 Additional Comments: left  Post Interventions Patient Tolerated: Well Instructions Provided: Care of device, Adjustment of device  [name] 09/02/2022, 12:19 PM

## 2022-09-02 NOTE — Progress Notes (Signed)
Orthopedic Tech Progress Note Patient Details:  Nathan Moran 1946/07/19 [id number]  Patient ID: Nathan Moran, male   DOB: 1946/01/03, 76 y.o.   MRN: [id number]  Nathan Moran 09/02/2022, 2:43 PMPatient in pain,  Request to come off CPM.

## 2022-09-03 ENCOUNTER — Encounter (HOSPITAL_COMMUNITY): Payer: Self-pay | Admitting: Orthopedic Surgery

## 2022-09-03 DIAGNOSIS — M1712 Unilateral primary osteoarthritis, left knee: Secondary | ICD-10-CM | POA: Diagnosis not present

## 2022-09-03 LAB — CBC
HCT: 39.6 % (ref 39.0–52.0)
Hemoglobin: 12.9 g/dL — ABNORMAL LOW (ref 13.0–17.0)
MCH: 31.1 pg (ref 26.0–34.0)
MCHC: 32.6 g/dL (ref 30.0–36.0)
MCV: 95.4 fL (ref 80.0–100.0)
Platelets: 218 10*3/uL (ref 150–400)
RBC: 4.15 MIL/uL — ABNORMAL LOW (ref 4.22–5.81)
RDW: 12.8 % (ref 11.5–15.5)
WBC: 13.2 10*3/uL — ABNORMAL HIGH (ref 4.0–10.5)
nRBC: 0 % (ref 0.0–0.2)

## 2022-09-03 LAB — BASIC METABOLIC PANEL
Anion gap: 6 (ref 5–15)
BUN: 21 mg/dL (ref 8–23)
CO2: 24 mmol/L (ref 22–32)
Calcium: 8.5 mg/dL — ABNORMAL LOW (ref 8.9–10.3)
Chloride: 106 mmol/L (ref 98–111)
Creatinine, Ser: 0.99 mg/dL (ref 0.61–1.24)
GFR, Estimated: >60 mL/min (ref >60)
Glucose, Bld: 171 mg/dL — ABNORMAL HIGH (ref 70–99)
Potassium: 4.5 mmol/L (ref 3.5–5.1)
Sodium: 136 mmol/L (ref 135–145)

## 2022-09-03 NOTE — TOC Transition Note (Deleted)
Transition of Care [facility]) - CM/SW Discharge Note   Patient Details  Name: Nathan Moran MRN: [id number] Date of Birth: 10/21/1946  [facility] CM/SW Contact:  Lennart Pall, LCSW Phone Number: 09/03/2022, 9:51 AM   Clinical Narrative:     Met with pt and confirming he has received RW via [employer] to room.  OPPT already set up with [facility] in [place].  No further TOC needs.  Final next level of care: OP Rehab Barriers to Discharge: No Barriers Identified   Patient Goals and CMS Choice Patient states their goals for this hospitalization and ongoing recovery are:: return home      Discharge Placement                       Discharge Plan and Services                DME Arranged: Walker rolling DME Agency: [facility]                  Social Determinants of Health (SDOH) Interventions     Readmission Risk Interventions     No data to display

## 2022-09-03 NOTE — Evaluation (Signed)
Occupational Therapy Evaluation Patient Details Name: Nathan Moran MRN: [id number] DOB: 1946-02-02 Today's Date: 09/03/2022   History of Present Illness 76 y/o male admit s/p LTKA 09/02/2022  with PMH of neuropathy ( pt reports) , arthiritis, GERD,HTN, h/o BLE DVT, s/p L1-4 laminectomy.   Clinical Impression   Patient is currently requiring assistance with ADLs including up to Total assist with Lower body ADLs, up to Minimal assist with Upper body ADLs,  as well as minimal assist with bed mobility and maximum assist with functional transfers to toilet with second person recommended as pt unable to complete 90* pivot and required chair dragged closer to pt for safety.  Current level of function is below patient's typical baseline.   During this evaluation, patient was limited by generalized weakness, impaired activity tolerance, and post-op pain as well as mild cognitive deficits with decreased attention to task/topic and decreased safety awareness, all of which has the potential to impact patient's and caregiver's safety and independence during functional mobility, as well as performance for ADLs.  Patient lives at home with his spouse who is able to provide 24/7 supervision and assistance, however pt currently requires more care than pt's spouse can safely provide.   Patient demonstrates good rehab potential, and should benefit from continued skilled occupational therapy services while in acute care to maximize safety, independence and quality of life at home.  Continued occupational therapy services in a SNF setting prior to return home is recommended.  ?     Recommendations for follow up therapy are one component of a multi-disciplinary discharge planning process, led by the attending physician.  Recommendations may be updated based on patient status, additional functional criteria and insurance authorization.   Follow Up Recommendations  Skilled nursing-short term rehab (<3 hours/day)     Assistance Recommended at Discharge Frequent or constant Supervision/Assistance  Patient can return home with the following A lot of help with walking and/or transfers;A lot of help with bathing/dressing/bathroom;Two people to help with walking and/or transfers;Assistance with cooking/housework;Assist for transportation;Help with stairs or ramp for entrance    Functional Status Assessment  Patient has had a recent decline in their functional status and demonstrates the ability to make significant improvements in function in a reasonable and predictable amount of time.  Equipment Recommendations  Other (comment) (Grab bars in shower)    Recommendations for Other Services       Precautions / Restrictions Precautions Precautions: Knee Required Braces or Orthoses: Knee Immobilizer - Left Knee Immobilizer - Left: Discontinue once straight leg raise with < 10 degree lag;On when out of bed or walking Restrictions Weight Bearing Restrictions: No      Mobility Bed Mobility Overal bed mobility: Needs Assistance Bed Mobility: Supine to Sit     Supine to sit: Min assist, HOB elevated     General bed mobility comments: required assist with LLE for support with pt able to control upper body as well as scooting to the edge of the bed with cues and increased time/effort. Pt is very weak in BLEs and UEs as well.    Transfers                          Balance Overall balance assessment: Needs assistance Sitting-balance support: No upper extremity supported, Bilateral upper extremity supported Sitting balance-Leahy Scale: Fair     Standing balance support: Bilateral upper extremity supported, During functional activity, Reliant on assistive device for balance Standing balance-Leahy Scale: Poor  ADL either performed or assessed with clinical judgement   ADL Overall ADL's : Needs assistance/impaired Eating/Feeding: Independent;Sitting    Grooming: Sitting;Set up   Upper Body Bathing: Min guard;Sitting   Lower Body Bathing: Maximal assistance;+2 for physical assistance;Sitting/lateral leans;Sit to/from stand   Upper Body Dressing : Minimal assistance;Sitting   Lower Body Dressing: Total assistance;Bed level Lower Body Dressing Details (indicate cue type and reason): To don socks and KI Toilet Transfer: Maximal assistance;Rolling walker (2 wheels);Stand-pivot;Cueing for safety;Cueing for sequencing Toilet Transfer Details (indicate cue type and reason): Pt stood from EOB with Mod As of 1 and EOB elevated. Cues for hand and foot placement. Pt used RW to pivot to chair with need of increasing Max As and need of pulling chair closer as pt could not perform an entire 90 degree pivot. Cues to reach back and Moderate assist to control descent to recliner. Toileting- Clothing Manipulation and Hygiene: Maximal assistance;Bed level Toileting - Clothing Manipulation Details (indicate cue type and reason): Based on general assessment.     Functional mobility during ADLs: Moderate assistance;Maximal assistance;Rolling walker (2 wheels);Cueing for sequencing;Cueing for safety;+2 for physical assistance       Vision Baseline Vision/History: 1 Wears glasses Ability to See in Adequate Light: 1 Impaired Patient Visual Report: No change from baseline Vision Assessment?: No apparent visual deficits     Perception     Praxis      Pertinent Vitals/Pain Pain Assessment Pain Assessment: 0-10 Pain Score: 10-Worst pain ever Pain Location: 0/10 at rest. 10/10 with weight bearing. Resolved once in recliner. Pain Descriptors / Indicators: Sore, Shooting, Grimacing Pain Intervention(s): Limited activity within patient's tolerance, Monitored during session, Repositioned, Other (comment), Utilized relaxation techniques     Hand Dominance Right   Extremity/Trunk Assessment Upper Extremity Assessment Upper Extremity Assessment: Generalized  weakness   Lower Extremity Assessment Lower Extremity Assessment: RLE deficits/detail;LLE deficits/detail RLE Deficits / Details: 3/5 PF, 5/5 DF, 2/5 hip flexion, 3/5 knee extension. unable to move R LE on and off bed with simple ADduction or abduction pattern, required full assist for RLE as well as LLE. LLE Deficits / Details: full knee extension lacking about 10 degrees, able to form knee extesnion/ quad contraction but still 3-/5 for knee extension, 2/5 PF, 5/5 DF, hip flexion 3/5       Communication Communication Communication: No difficulties   Cognition Arousal/Alertness: Awake/alert Behavior During Therapy: WFL for tasks assessed/performed Overall Cognitive Status: Within Functional Limits for tasks assessed                                 General Comments: Ox4. Deviates from topic and needs intermittent redirecting.     General Comments       Exercises     Shoulder Instructions      Home Living                     Bathroom Shower/Tub: Walk-in shower   Bathroom Toilet: Standard     Home Equipment: Agricultural consultant (2 wheels);Shower seat;Hand held shower head;Adaptive equipment [employer] bars are pending but it has been 2 years.) Adaptive Equipment: Reacher Additional Comments: pt has lift chair as well.      Prior Functioning/Environment Prior Level of Function : Needs assist;History of Falls (last six months);Patient poor historian/Family not available;Driving       Physical Assist : Mobility (physical);ADLs (physical) Mobility (physical): Gait;Transfers ADLs (physical): IADLs Mobility Comments:  Pt was relying a lot on his WC requiring assist with some mobility and history of falls as recent as yesterday ( 09/01/2022 ) before coming into surgery. Difficult to get pt to full describe prior level of function clearly. ADLs Comments: Pt's wife does most of it but pt reports that he could have performed. Pt reports that he does his own  laundry.        OT Problem List: Decreased strength;Pain;Impaired balance (sitting and/or standing);Decreased knowledge of use of DME or AE;Obesity;Decreased safety awareness;Decreased activity tolerance;Decreased cognition      OT Treatment/Interventions: Self-care/ADL training;Therapeutic exercise;Therapeutic activities;Patient/family education;DME and/or AE instruction;Balance training    OT Goals(Current goals can be found in the care plan section) Acute Rehab OT Goals Patient Stated Goal: "I want to go to [facility] in [place], [place], but my wife said, 'no'." OT Goal Formulation: With patient Time For Goal Achievement: 09/17/22 Potential to Achieve Goals: Good ADL Goals Pt Will Perform Lower Body Bathing: with adaptive equipment;with min guard assist;sit to/from stand;sitting/lateral leans Pt Will Perform Lower Body Dressing: with adaptive equipment;sitting/lateral leans;sit to/from stand;with min guard assist Pt Will Transfer to Toilet: with min guard assist;stand pivot transfer Pt/caregiver will Perform Home Exercise Program: Increased strength;Both right and left upper extremity;With Supervision Additional ADL Goal #1: Pt will engage in 10 min dynamic functional activities without loss of sitting or standing balance, in order to demonstrate improved activity tolerance and balance needed to perform ADLs safely at home with supervision.  OT Frequency: Min 2X/week    Co-evaluation              AM-PAC OT "6 Clicks" Daily Activity     Outcome Measure Help from another person eating meals?: None Help from another person taking care of personal grooming?: A Little Help from another person toileting, which includes using toliet, bedpan, or urinal?: A Lot Help from another person bathing (including washing, rinsing, drying)?: A Lot Help from another person to put on and taking off regular upper body clothing?: A Little Help from another person to put on and taking off  regular lower body clothing?: Total 6 Click Score: 15   End of Session Equipment Utilized During Treatment: Gait belt;Rolling walker (2 wheels) CPM Left Knee CPM Left Knee: Off Nurse Communication: Mobility status;Other (comment) (Up in chair. 10/10 pain with WB)  Activity Tolerance: Patient limited by pain Patient left: in chair;with call bell/phone within reach  OT Visit Diagnosis: Unsteadiness on feet (R26.81);Pain;Repeated falls (R29.6);History of falling (Z91.81);Muscle weakness (generalized) (M62.81) Pain - Right/Left: Left Pain - part of body: Knee                Time: 0936-1010 OT Time Calculation (min): 34 min Charges:  OT General Charges $OT Visit: 1 Visit OT Evaluation $OT Eval Low Complexity: 1 Low OT Treatments $Therapeutic Activity: 8-22 mins  Anderson Malta, OT Acute Rehab Services Office: [phone] 09/03/2022  Julien Girt 09/03/2022, 10:22 AM

## 2022-09-03 NOTE — Progress Notes (Signed)
Physical Therapy Treatment Patient Details Name: Nathan Moran MRN: [id number] DOB: 1946-02-18 Today's Date: 09/03/2022   History of Present Illness 76 y/o male admit s/p LTKA 09/02/2022  with PMH of neuropathy ( pt reports) , arthiritis, GERD,HTN, h/o BLE DVT, s/p L1-4 laminectomy.    PT Comments    POD # 1 pm session General Comments: AxO x 3 very pleasant but admits to using his wheelchair to "get around" and has a LIFT CHAIR. Assisted out of recliner was difficult.  General transfer comment: pt was unable to self rise from recliner.  Required + 2 side by side assist to perform sit to stand.  Pt admits to using a LIFT CHAIR at home. General Gait Details: VERY limited amb distance of 4 feet with much effort. Limited by pain/fatigue/general deconditioning/weakness. KI on for support. Assisted back to bed.  General bed mobility comments: demonstarted and instructed how to use belt to self assist LE back onto bed. Performed a few TE's followed by ICE. Pt will need ST Rehab at SNF to address mobility and functional decline prior to safely returning home.   Recommendations for follow up therapy are one component of a multi-disciplinary discharge planning process, led by the attending physician.  Recommendations may be updated based on patient status, additional functional criteria and insurance authorization.  Follow Up Recommendations  Follow physician's recommendations for discharge plan and follow up therapies     Assistance Recommended at Discharge Frequent or constant Supervision/Assistance  Patient can return home with the following A lot of help with walking and/or transfers;A lot of help with bathing/dressing/bathroom;Assist for transportation;Help with stairs or ramp for entrance   Equipment Recommendations  None recommended by PT    Recommendations for Other Services       Precautions / Restrictions Precautions Precautions: Knee Precaution Comments: instructed no pillow  under knee Required Braces or Orthoses: Knee Immobilizer - Right Knee Immobilizer - Left: Discontinue once straight leg raise with < 10 degree lag;On when out of bed or walking Restrictions Weight Bearing Restrictions: No Other Position/Activity Restrictions: WBAT     Mobility  Bed Mobility Overal bed mobility: Needs Assistance Bed Mobility: Sit to Supine       Sit to supine: Max assist   General bed mobility comments: demonstarted and instructed how to use belt to self assist LE back onto bed.    Transfers Overall transfer level: Needs assistance Equipment used: Rolling walker (2 wheels) Transfers: Sit to/from Stand Sit to Stand: Total assist, Max assist           General transfer comment: pt was unable to self rise from recliner.  Required + 2 side by side assist to perform sit to stand.  Pt admits to using a LIFT CHAIR at home.    Ambulation/Gait Ambulation/Gait assistance: Mod assist, +2 physical assistance, +2 safety/equipment Gait Distance (Feet): 4 Feet Assistive device: Rolling walker (2 wheels) Gait Pattern/deviations: Step-to pattern, Decreased stance time - left Gait velocity: decreased     General Gait Details: VERY limited amb distance of 4 feet with much effort.  Limited by pain/fatigue/general deconditioning/weakness. KI on for support.   Stairs             Wheelchair Mobility    Modified Rankin (Stroke Patients Only)       Balance  Cognition Arousal/Alertness: Awake/alert Behavior During Therapy: WFL for tasks assessed/performed Overall Cognitive Status: Within Functional Limits for tasks assessed                                 General Comments: AxO x 3 very pleasant but admits to using his wheelchair to "get around" and has a LIFT CHAIR        Exercises Total Knee Replacement TE's following HEP handout 10 reps B LE ankle pumps 05 reps knee presses 05  reps heel slides  05 reps SLR's 05 reps ABD Educated on use of gait belt to assist with TE's Followed by ICE      General Comments        Pertinent Vitals/Pain Pain Assessment Pain Assessment: 0-10 Pain Score: 7  Faces Pain Scale: Hurts whole lot Pain Location: L knee Pain Descriptors / Indicators: Sore, Shooting, Grimacing, Operative site guarding Pain Intervention(s): Monitored during session, Premedicated before session, Repositioned, Ice applied    Home Living                          Prior Function            PT Goals (current goals can now be found in the care plan section) Progress towards PT goals: Progressing toward goals    Frequency    7X/week      PT Plan Current plan remains appropriate    Co-evaluation              AM-PAC PT "6 Clicks" Mobility   Outcome Measure  Help needed turning from your back to your side while in a flat bed without using bedrails?: A Lot Help needed moving from lying on your back to sitting on the side of a flat bed without using bedrails?: A Lot Help needed moving to and from a bed to a chair (including a wheelchair)?: A Lot Help needed standing up from a chair using your arms (e.g., wheelchair or bedside chair)?: A Lot Help needed to walk in hospital room?: A Lot Help needed climbing 3-5 steps with a railing? : Total 6 Click Score: 11    End of Session Equipment Utilized During Treatment: Gait belt Activity Tolerance: Patient limited by fatigue Patient left: in bed;with bed alarm set;with call bell/phone within reach;with family/visitor present Nurse Communication: Mobility status PT Visit Diagnosis: Unsteadiness on feet (R26.81);Other abnormalities of gait and mobility (R26.89);Muscle weakness (generalized) (M62.81);History of falling (Z91.81)     Time: 2376-2831 PT Time Calculation (min) (ACUTE ONLY): 25 min  Charges:  $Gait Training: 8-22 mins $Therapeutic Activity: 8-22 mins                      {Jaicey Sweaney  PTA Acute  Rehabilitation Services Office M-F          [phone] Weekend pager [phone]

## 2022-09-03 NOTE — Progress Notes (Signed)
   Subjective: 1 Day Post-Op Procedure(s) (LRB): TOTAL KNEE ARTHROPLASTY (Left) Patient seen in rounds by Dr. Wynelle Link. Patient is well, and has had no acute complaints or problems. Denies SOB or chest pain. Denies calf pain. Foley cath to be removed this AM. Patient reports pain as moderate.  We will continue physical therapy today.  Objective: Vital signs in last 24 hours: Temp:  [97.5 F (36.4 C)-98.7 F (37.1 C)] 98.7 F (37.1 C) (10/17 0602) Pulse Rate:  [61-100] 81 (10/17 0602) Resp:  [14-19] 14 (10/17 0602) BP: (110-171)/(62-119) 113/62 (10/17 0602) SpO2:  [93 %-99 %] 95 % (10/17 0602) Weight:  [112.5 kg] 112.5 kg (10/16 0840)  Intake/Output from previous day:  Intake/Output Summary (Last 24 hours) at 09/03/2022 0713 Last data filed at 09/03/2022 0605 Gross per 24 hour  Intake 3008.8 ml  Output 1325 ml  Net 1683.8 ml     Intake/Output this shift: No intake/output data recorded.  Labs: Recent Labs    09/03/22 0333  HGB 12.9*   Recent Labs    09/03/22 0333  WBC 13.2*  RBC 4.15*  HCT 39.6  PLT 218   Recent Labs    09/03/22 0333  NA 136  K 4.5  CL 106  CO2 24  BUN 21  CREATININE 0.99  GLUCOSE 171*  CALCIUM 8.5*   No results for input(s): "LABPT", "INR" in the last 72 hours.  Exam: General - Patient is Alert and Oriented Extremity - Neurologically intact Neurovascular intact Sensation intact distally Dorsiflexion/Plantar flexion intact Dressing - dressing C/D/I Motor Function - intact, moving foot and toes well on exam.  Past Medical History:  Diagnosis Date   Anxiety    Arthritis    Bronchitis    Depression    Dyspnea    GERD (gastroesophageal reflux disease)    History of kidney stones    Hypertension    Peripheral vascular disease (HCC)    Pneumonia    Pre-diabetes    Sleep apnea    uses CPAP    Assessment/Plan: 1 Day Post-Op Procedure(s) (LRB): TOTAL KNEE ARTHROPLASTY (Left) Principal Problem:   OA (osteoarthritis) of  knee Active Problems:   Osteoarthritis of left knee  Estimated body mass index is 36.62 kg/m as calculated from the following:   Height as of this encounter: 5\' 9"  (1.753 m).   Weight as of this encounter: 112.5 kg. Advance diet Up with therapy D/C IV fluids  Patient's anticipated LOS is less than 2 midnights, meeting these requirements: - Lives within 1 hour of care - Has a competent adult at home to recover with post-op - NO history of  - Chronic pain requiring opioids  - Diabetes  - Coronary Artery Disease  - Heart failure  - Heart attack  - Stroke  - Cardiac arrhythmia  - Respiratory Failure/COPD  - Renal failure  - Anemia  - Advanced Liver disease  DVT Prophylaxis - Xarelto Weight bearing as tolerated.  Plan is to go Home after hospital stay. He will require additional night(s) in the hospital to maximize mobility as he was very deconditioned prior to surgery. Continue physical therapy.  [name], PA-C Orthopedic Surgery [phone] 09/03/2022, 7:13 AM

## 2022-09-03 NOTE — Progress Notes (Signed)
Physical Therapy Treatment Patient Details Name: Nathan Moran MRN: [id number] DOB: 1946/03/27 Today's Date: 09/03/2022   History of Present Illness 76 y/o male admit s/p LTKA 09/02/2022  with PMH of neuropathy ( pt reports) , arthiritis, GERD,HTN, h/o BLE DVT, s/p L1-4 laminectomy.    PT Comments    POD # 1 am session Pt is AxO x 3 very pleasant.  OOB in recliner via OT.  General transfer comment: pt was unable to self rise from recliner.  Required + 2 side by side assist to perform sit to stand.  Pt admits to using a LIFT CHAIR at home.General Gait Details: VERY limited amb distance of 3 feet with much effort.  Limited by pain/fatigue/general deconditioning/weakness. Performed a few TE's followed by ICE. Pt will need another PT session this afternoon.   Recommendations for follow up therapy are one component of a multi-disciplinary discharge planning process, led by the attending physician.  Recommendations may be updated based on patient status, additional functional criteria and insurance authorization.  Follow Up Recommendations  Follow physician's recommendations for discharge plan and follow up therapies     Assistance Recommended at Discharge Frequent or constant Supervision/Assistance  Patient can return home with the following A lot of help with walking and/or transfers;A lot of help with bathing/dressing/bathroom;Assist for transportation;Help with stairs or ramp for entrance   Equipment Recommendations  None recommended by PT    Recommendations for Other Services       Precautions / Restrictions Precautions Precautions: Knee Precaution Comments: instructed no pillow under knee Required Braces or Orthoses: Knee Immobilizer - Right Knee Immobilizer - Left: Discontinue once straight leg raise with < 10 degree lag;On when out of bed or walking Restrictions Weight Bearing Restrictions: No Other Position/Activity Restrictions: WBAT     Mobility  Bed Mobility                General bed mobility comments: OOB in recliner via OT    Transfers Overall transfer level: Needs assistance Equipment used: Rolling walker (2 wheels) Transfers: Sit to/from Stand Sit to Stand: Total assist, Max assist           General transfer comment: pt was unable to self rise from recliner.  Required + 2 side by side assist to perform sit to stand.  Pt admits to using a LIFT CHAIR at home.    Ambulation/Gait Ambulation/Gait assistance: Mod assist, +2 physical assistance, +2 safety/equipment Gait Distance (Feet): 3 Feet Assistive device: Rolling walker (2 wheels) Gait Pattern/deviations: Step-to pattern, Decreased stance time - left Gait velocity: decreased     General Gait Details: VERY limited amb distance of 3 feet with much effort.  Limited by pain/fatigue/general deconditioning/weakness.   Stairs             Wheelchair Mobility    Modified Rankin (Stroke Patients Only)       Balance                                            Cognition Arousal/Alertness: Awake/alert Behavior During Therapy: WFL for tasks assessed/performed Overall Cognitive Status: Within Functional Limits for tasks assessed                                 General Comments: AxO x 3 very pleasant  Exercises  15 reps AP 10 reps HS    General Comments        Pertinent Vitals/Pain Pain Assessment Pain Assessment: Faces Faces Pain Scale: Hurts whole lot Pain Location: L knee Pain Descriptors / Indicators: Sore, Shooting, Grimacing, Operative site guarding Pain Intervention(s): Monitored during session, Repositioned, Ice applied    Home Living                   Home Equipment: Conservation officer, nature (2 wheels);Shower seat;Hand held shower head;Adaptive equipment (Shower grab bars are pending but it has been 2 years.) Additional Comments: pt has lift chair as well.    Prior Function            PT Goals (current  goals can now be found in the care plan section) Progress towards PT goals: Progressing toward goals    Frequency    7X/week      PT Plan Current plan remains appropriate    Co-evaluation              AM-PAC PT "6 Clicks" Mobility   Outcome Measure  Help needed turning from your back to your side while in a flat bed without using bedrails?: A Lot Help needed moving from lying on your back to sitting on the side of a flat bed without using bedrails?: A Lot Help needed moving to and from a bed to a chair (including a wheelchair)?: A Lot Help needed standing up from a chair using your arms (e.g., wheelchair or bedside chair)?: A Lot Help needed to walk in hospital room?: Total Help needed climbing 3-5 steps with a railing? : Total 6 Click Score: 10    End of Session Equipment Utilized During Treatment: Gait belt Activity Tolerance: Patient limited by fatigue Patient left: in chair;with call bell/phone within reach;with chair alarm set Nurse Communication: Mobility status PT Visit Diagnosis: Unsteadiness on feet (R26.81);Other abnormalities of gait and mobility (R26.89);Muscle weakness (generalized) (M62.81);History of falling (Z91.81)     Time: 1035-1100 PT Time Calculation (min) (ACUTE ONLY): 25 min  Charges:  $Gait Training: 8-22 mins $Therapeutic Activity: 8-22 mins                     Rica Koyanagi  PTA Acute  Rehabilitation Services Office M-F          [phone] Weekend pager [phone]

## 2022-09-03 NOTE — Plan of Care (Signed)
?  Problem: Education: ?Goal: Knowledge of the prescribed therapeutic regimen will improve ?Outcome: Progressing ?  ?Problem: Education: ?Goal: Knowledge of General Education information will improve ?Description: Including pain rating scale, medication(s)/side effects and non-pharmacologic comfort measures ?Outcome: Progressing ?  ?Problem: Activity: ?Goal: Risk for activity intolerance will decrease ?Outcome: Progressing ?  ?

## 2022-09-04 DIAGNOSIS — M1712 Unilateral primary osteoarthritis, left knee: Secondary | ICD-10-CM | POA: Diagnosis not present

## 2022-09-04 LAB — CBC
HCT: 34.8 % — ABNORMAL LOW (ref 39.0–52.0)
Hemoglobin: 11.9 g/dL — ABNORMAL LOW (ref 13.0–17.0)
MCH: 32 pg (ref 26.0–34.0)
MCHC: 34.2 g/dL (ref 30.0–36.0)
MCV: 93.5 fL (ref 80.0–100.0)
Platelets: 189 10*3/uL (ref 150–400)
RBC: 3.72 MIL/uL — ABNORMAL LOW (ref 4.22–5.81)
RDW: 13 % (ref 11.5–15.5)
WBC: 14.3 10*3/uL — ABNORMAL HIGH (ref 4.0–10.5)
nRBC: 0 % (ref 0.0–0.2)

## 2022-09-04 NOTE — Plan of Care (Signed)
  Problem: Education: Goal: Knowledge of the prescribed therapeutic regimen will improve Outcome: Progressing   

## 2022-09-04 NOTE — Plan of Care (Signed)
  Problem: Education: Goal: Knowledge of General Education information will improve Description Including pain rating scale, medication(s)/side effects and non-pharmacologic comfort measures Outcome: Progressing   

## 2022-09-04 NOTE — NC FL2 (Signed)
[place] LEVEL OF CARE SCREENING TOOL     IDENTIFICATION  Patient Name: Nathan Moran Birthdate: [birth year] Sex: male Admission Date (Current Location): 09/02/2022  [facility] and [place] Number:  Herbalist and Address:  [facility],  [place] [address], [place]      Provider Number: [id number]  Attending Physician Name and Address:  Gaynelle Arabian, MD  Relative Name and Phone Number:  wife, Crystal Ellwood @ [phone]    Current Level of Care: Hospital Recommended Level of Care: [place] Prior Approval Number:    Date Approved/Denied:   PASRR Number: [id number]  Discharge Plan: SNF    Current Diagnoses: Patient Active Problem List   Diagnosis Date Noted   OA (osteoarthritis) of knee 09/02/2022   Osteoarthritis of left knee 09/02/2022   Spinal stenosis of lumbar region 01/12/2020    Orientation RESPIRATION BLADDER Height & Weight     Self, Time, Situation, Place  Normal Continent Weight: 248 lb (112.5 kg) Height:  5\' 9"  (175.3 cm)  BEHAVIORAL SYMPTOMS/MOOD NEUROLOGICAL BOWEL NUTRITION STATUS      Continent Diet (Regular)  AMBULATORY STATUS COMMUNICATION OF NEEDS Skin   Extensive Assist Verbally Other (Comment) (surgical incision only)                       Personal Care Assistance Level of Assistance  Bathing, Dressing Bathing Assistance: Limited assistance   Dressing Assistance: Limited assistance     Functional Limitations Info             Bear  OT (By licensed OT), PT (By licensed PT)     PT Frequency: 5x/wk OT Frequency: 5x/wk            Contractures Contractures Info: Not present    Additional Factors Info  Code Status, Allergies, Psychotropic Code Status Info: Full Allergies Info: codeine Psychotropic Info: see MAR         Current Medications (09/04/2022):  This is the current hospital active medication list Current  Facility-Administered Medications  Medication Dose Route Frequency Provider Last Rate Last Admin   0.9 %  sodium chloride infusion   Intravenous Continuous Jearld Lesch, PA 20 mL/hr at 09/03/22 0355 Rate Change at 09/03/22 0355   bisacodyl (DULCOLAX) suppository 10 mg  10 mg Rectal Daily PRN Jearld Lesch, PA       buPROPion (WELLBUTRIN XL) 24 hr tablet 300 mg  300 mg Oral Daily Jearld Lesch, PA   300 mg at 09/04/22 2841   colchicine tablet 0.6 mg  0.6 mg Oral Daily Jearld Lesch, PA   0.6 mg at 09/04/22 0926   diphenhydrAMINE (BENADRYL) 12.5 MG/5ML elixir 12.5-25 mg  12.5-25 mg Oral Q4H PRN Jearld Lesch, PA   25 mg at 09/03/22 0355   docusate sodium (COLACE) capsule 100 mg  100 mg Oral BID Jearld Lesch, PA   100 mg at 09/04/22 3244   furosemide (LASIX) tablet 20 mg  20 mg Oral Daily Jearld Lesch, PA   20 mg at 09/04/22 0925   gabapentin (NEURONTIN) capsule 300 mg  300 mg Oral BID Jearld Lesch, PA   300 mg at 09/04/22 0102   HYDROmorphone (DILAUDID) injection 0.5-1 mg  0.5-1 mg Intravenous Q2H PRN Jearld Lesch, PA   1 mg at 09/03/22 2159   losartan (COZAAR) tablet 50 mg  50 mg Oral Daily Jearld Lesch, PA   50 mg at 09/04/22 0926   menthol-cetylpyridinium (CEPACOL) lozenge  3 mg  1 lozenge Oral PRN Jearld Lesch, PA       Or   phenol (CHLORASEPTIC) mouth spray 1 spray  1 spray Mouth/Throat PRN Jearld Lesch, PA       methocarbamol (ROBAXIN) tablet 500 mg  500 mg Oral Q6H PRN Jearld Lesch, PA   500 mg at 09/03/22 [id number]   Or   methocarbamol (ROBAXIN) 500 mg in dextrose 5 % 50 mL IVPB  500 mg Intravenous Q6H PRN Jearld Lesch, PA       metoCLOPramide (REGLAN) tablet 5-10 mg  5-10 mg Oral Q8H PRN Jearld Lesch, PA       Or   metoCLOPramide (REGLAN) injection 5-10 mg  5-10 mg Intravenous Q8H PRN Jearld Lesch, PA       nitrofurantoin (MACRODANTIN) capsule 100 mg  100 mg Oral QHS Jearld Lesch, PA   100 mg at 09/03/22 2129    ondansetron (ZOFRAN) tablet 4 mg  4 mg Oral Q6H PRN Jearld Lesch, PA       Or   ondansetron (ZOFRAN) injection 4 mg  4 mg Intravenous Q6H PRN Jearld Lesch, PA       oxyCODONE (Oxy IR/ROXICODONE) immediate release tablet 10-15 mg  10-15 mg Oral Q4H PRN Jearld Lesch, PA   15 mg at 09/04/22 [id number]   oxyCODONE (Oxy IR/ROXICODONE) immediate release tablet 5 mg  5 mg Oral Q4H PRN Jearld Lesch, PA       pantoprazole (PROTONIX) EC tablet 40 mg  40 mg Oral QHS Gaynelle Arabian, MD   40 mg at 09/03/22 2118   polyethylene glycol (MIRALAX / GLYCOLAX) packet 17 g  17 g Oral Daily PRN Jearld Lesch, PA       rivaroxaban (XARELTO) tablet 10 mg  10 mg Oral Q breakfast Jearld Lesch, PA   10 mg at 09/04/22 [id number]   senna (SENOKOT) tablet 17.2 mg  2 tablet Oral QHS Jearld Lesch, PA   17.2 mg at 09/03/22 2119   sodium phosphate (FLEET) 7-19 GM/118ML enema 1 enema  1 enema Rectal Once PRN Jearld Lesch, PA       tamsulosin (FLOMAX) capsule 0.4 mg  0.4 mg Oral QHS Jearld Lesch, PA   0.4 mg at 09/03/22 2129   temazepam (RESTORIL) capsule 15 mg  15 mg Oral QHS Jearld Lesch, PA   15 mg at 09/03/22 2128     Discharge Medications: Please see discharge summary for a list of discharge medications.  Relevant Imaging Results:  Relevant Lab Results:   Additional Information SS# [id number]  Lennart Pall, LCSW

## 2022-09-04 NOTE — Progress Notes (Signed)
   Subjective: 2 Days Post-Op Procedure(s) (LRB): TOTAL KNEE ARTHROPLASTY (Left) Patient seen in rounds for Dr. Wynelle Link. Patient is well, and has had no acute complaints or problems. Denies SOB or chest pain. Denies calf pain. Patient reports pain as mild.  Worked with physical therapy yesterday but was very limited due to significant deconditioning prior to surgery. Patient inquiring on SNF after hospital stay.  Objective: Vital signs in last 24 hours: Temp:  [97.9 F (36.6 C)-98 F (36.7 C)] 97.9 F (36.6 C) (10/18 0532) Pulse Rate:  [67-85] 67 (10/18 0532) Resp:  [17-18] 18 (10/18 0532) BP: (128-151)/(67-68) 142/67 (10/18 0532) SpO2:  [95 %-97 %] 96 % (10/18 0532)  Intake/Output from previous day:  Intake/Output Summary (Last 24 hours) at 09/04/2022 0801 Last data filed at 09/03/2022 1400 Gross per 24 hour  Intake 500 ml  Output 500 ml  Net 0 ml    Intake/Output this shift: No intake/output data recorded.  Labs: Recent Labs    09/03/22 0333 09/04/22 0342  HGB 12.9* 11.9*   Recent Labs    09/03/22 0333 09/04/22 0342  WBC 13.2* 14.3*  RBC 4.15* 3.72*  HCT 39.6 34.8*  PLT 218 189   Recent Labs    09/03/22 0333  NA 136  K 4.5  CL 106  CO2 24  BUN 21  CREATININE 0.99  GLUCOSE 171*  CALCIUM 8.5*   No results for input(s): "LABPT", "INR" in the last 72 hours.  Exam: General - Patient is Alert and Oriented Extremity - Neurologically intact Neurovascular intact Sensation intact distally Dorsiflexion/Plantar flexion intact Dressing/Incision - clean, dry, no drainage Motor Function - intact, moving foot and toes well on exam.  Past Medical History:  Diagnosis Date   Anxiety    Arthritis    Bronchitis    Depression    Dyspnea    GERD (gastroesophageal reflux disease)    History of kidney stones    Hypertension    Peripheral vascular disease (HCC)    Pneumonia    Pre-diabetes    Sleep apnea    uses CPAP    Assessment/Plan: 2 Days Post-Op  Procedure(s) (LRB): TOTAL KNEE ARTHROPLASTY (Left) Principal Problem:   OA (osteoarthritis) of knee Active Problems:   Osteoarthritis of left knee  Estimated body mass index is 36.62 kg/m as calculated from the following:   Height as of this encounter: 5\' 9"  (1.753 m).   Weight as of this encounter: 112.5 kg.  DVT Prophylaxis - Xarelto Weight-bearing as tolerated.  Continue with physical therapy while in hospital. Would benefit from SNF placement due to significant deconditioning and slow progression. Consult to social work placed for help in bed location.  R. Jaynie Bream, PA-C Orthopedic Surgery [phone] 09/04/2022, 8:01 AM

## 2022-09-04 NOTE — Progress Notes (Signed)
Physical Therapy Treatment Patient Details Name: Nathan Moran MRN: [id number] DOB: 04-Oct-1946 Today's Date: 09/04/2022   History of Present Illness 76 y/o male admit s/p LTKA 09/02/2022  with PMH of neuropathy ( pt reports) , arthiritis, GERD,HTN, h/o BLE DVT, s/p L1-4 laminectomy.    PT Comments    Pt assisted back to bed from recliner.  Pt requiring +2 assist due to weakness and poor balance.  Pt also with increased pain this afternoon.  Pt reports he performed exercises in bed last night for about 3 hrs so did not perform today and pt encouraged to perform in moderation at this time (only one set of 10 repetitions and frequent ankle pumps tonight if wishes to perform exercises).  Continue to recommend SNF upon d/c.   Recommendations for follow up therapy are one component of a multi-disciplinary discharge planning process, led by the attending physician.  Recommendations may be updated based on patient status, additional functional criteria and insurance authorization.  Follow Up Recommendations  Skilled nursing-short term rehab (<3 hours/day) Can patient physically be transported by private vehicle: No   Assistance Recommended at Discharge Frequent or constant Supervision/Assistance  Patient can return home with the following A lot of help with bathing/dressing/bathroom;Assist for transportation;Help with stairs or ramp for entrance;Two people to help with walking and/or transfers   Equipment Recommendations  None recommended by PT    Recommendations for Other Services       Precautions / Restrictions Precautions Precautions: Knee Required Braces or Orthoses: Knee Immobilizer - Right Knee Immobilizer - Left: Discontinue once straight leg raise with < 10 degree lag;On when out of bed or walking Restrictions Other Position/Activity Restrictions: WBAT     Mobility  Bed Mobility Overal bed mobility: Needs Assistance Bed Mobility: Sit to Supine     Supine to sit: HOB  elevated, Min guard Sit to supine: Min assist   General bed mobility comments: verbal cues for self assist, assist for Lt LE onto bed    Transfers Overall transfer level: Needs assistance Equipment used: Rolling walker (2 wheels) Transfers: Sit to/from Stand, Bed to chair/wheelchair/BSC Sit to Stand: Max assist, +2 physical assistance   Step pivot transfers: Mod assist, +2 physical assistance       General transfer comment: verbal cues for technique, positioning, weight shifting; assist for weakness and steadying; increased pain and pt more unsteady with transfer this afternoon    Ambulation/Gait Ambulation/Gait assistance: Mod assist, Max assist, +2 physical assistance Gait Distance (Feet): 4 Feet Assistive device: Rolling walker (2 wheels) Gait Pattern/deviations: Step-to pattern, Decreased stance time - left       General Gait Details: unsteady and requiring significant assist, recliner pulled up to pt to sit down due to LOB; max cues for technique and use of UEs through RW to self support   Stairs             Wheelchair Mobility    Modified Rankin (Stroke Patients Only)       Balance Overall balance assessment: Needs assistance         Standing balance support: Bilateral upper extremity supported, During functional activity, Reliant on assistive device for balance Standing balance-Leahy Scale: Zero                              Cognition Arousal/Alertness: Awake/alert Behavior During Therapy: WFL for tasks assessed/performed Overall Cognitive Status: Within Functional Limits for tasks assessed  Exercises      General Comments        Pertinent Vitals/Pain Pain Assessment Pain Assessment: 0-10 Pain Score: 8  Pain Location: L knee Pain Descriptors / Indicators: Aching, Sore Pain Intervention(s): Repositioned, Monitored during session, Ice applied    Home Living                           Prior Function            PT Goals (current goals can now be found in the care plan section) Progress towards PT goals: Progressing toward goals    Frequency    7X/week      PT Plan Current plan remains appropriate    Co-evaluation              AM-PAC PT "6 Clicks" Mobility   Outcome Measure  Help needed turning from your back to your side while in a flat bed without using bedrails?: A Lot Help needed moving from lying on your back to sitting on the side of a flat bed without using bedrails?: A Lot Help needed moving to and from a bed to a chair (including a wheelchair)?: A Lot Help needed standing up from a chair using your arms (e.g., wheelchair or bedside chair)?: A Lot Help needed to walk in hospital room?: Total Help needed climbing 3-5 steps with a railing? : Total 6 Click Score: 10    End of Session Equipment Utilized During Treatment: Gait belt Activity Tolerance: Patient limited by fatigue Patient left: in bed;with call bell/phone within reach;with bed alarm set   PT Visit Diagnosis: Unsteadiness on feet (R26.81);Muscle weakness (generalized) (M62.81)     Time: 1275-1700 PT Time Calculation (min) (ACUTE ONLY): 10 min  Charges:   $Therapeutic Activity: 8-22 mins                     Thomasene Mohair PT, DPT Physical Therapist Acute Rehabilitation Services Preferred contact method: Secure Chat Weekend Pager Only: [phone] Office: [phone]    Nathan Moran 09/04/2022, 2:54 PM

## 2022-09-04 NOTE — Progress Notes (Signed)
Physical Therapy Treatment Patient Details Name: Nathan Moran MRN: [id number] DOB: 06/09/1946 Today's Date: 09/04/2022   History of Present Illness 76 y/o male admit s/p LTKA 09/02/2022  with PMH of neuropathy ( pt reports) , arthiritis, GERD,HTN, h/o BLE DVT, s/p L1-4 laminectomy.    PT Comments    Pt able to assist more with bed mobility today however sit to stands and attempts to ambulate requiring significant assist due to deconditioning and generalized weakness prior to surgery.  Continue to recommend SNF upon d/c as pt does not appear safe to d/c home at current assist level and high fall risk.    Recommendations for follow up therapy are one component of a multi-disciplinary discharge planning process, led by the attending physician.  Recommendations may be updated based on patient status, additional functional criteria and insurance authorization.  Follow Up Recommendations  Skilled nursing-short term rehab (<3 hours/day) Can patient physically be transported by private vehicle: No   Assistance Recommended at Discharge Frequent or constant Supervision/Assistance  Patient can return home with the following A lot of help with bathing/dressing/bathroom;Assist for transportation;Help with stairs or ramp for entrance;Two people to help with walking and/or transfers   Equipment Recommendations  None recommended by PT    Recommendations for Other Services       Precautions / Restrictions Precautions Precautions: Knee Required Braces or Orthoses: Knee Immobilizer - Right Knee Immobilizer - Left: Discontinue once straight leg raise with < 10 degree lag;On when out of bed or walking Restrictions Weight Bearing Restrictions: No Other Position/Activity Restrictions: WBAT     Mobility  Bed Mobility Overal bed mobility: Needs Assistance Bed Mobility: Supine to Sit     Supine to sit: HOB elevated, Min guard     General bed mobility comments: verbal cues for self assist     Transfers Overall transfer level: Needs assistance Equipment used: Rolling walker (2 wheels) Transfers: Sit to/from Stand Sit to Stand: Max assist, +2 physical assistance           General transfer comment: verbal cues for technique, positioning, weight shifting; assist for weakness    Ambulation/Gait Ambulation/Gait assistance: Mod assist, Max assist, +2 physical assistance Gait Distance (Feet): 4 Feet Assistive device: Rolling walker (2 wheels) Gait Pattern/deviations: Step-to pattern, Decreased stance time - left       General Gait Details: unsteady and requiring significant assist, recliner pulled up to pt to sit down due to LOB; max cues for technique and use of UEs through RW to self support   Stairs             Wheelchair Mobility    Modified Rankin (Stroke Patients Only)       Balance Overall balance assessment: Needs assistance         Standing balance support: Bilateral upper extremity supported, During functional activity, Reliant on assistive device for balance Standing balance-Leahy Scale: Poor                              Cognition Arousal/Alertness: Awake/alert Behavior During Therapy: WFL for tasks assessed/performed Overall Cognitive Status: Within Functional Limits for tasks assessed                                          Exercises      General Comments  Pertinent Vitals/Pain Pain Assessment Pain Assessment: 0-10 Pain Score: 5  Pain Location: L knee Pain Descriptors / Indicators: Aching, Sore Pain Intervention(s): Repositioned, Monitored during session, Premedicated before session    Home Living                          Prior Function            PT Goals (current goals can now be found in the care plan section) Progress towards PT goals: Progressing toward goals    Frequency    7X/week      PT Plan Current plan remains appropriate    Co-evaluation               AM-PAC PT "6 Clicks" Mobility   Outcome Measure  Help needed turning from your back to your side while in a flat bed without using bedrails?: A Lot Help needed moving from lying on your back to sitting on the side of a flat bed without using bedrails?: A Lot Help needed moving to and from a bed to a chair (including a wheelchair)?: A Lot Help needed standing up from a chair using your arms (e.g., wheelchair or bedside chair)?: A Lot Help needed to walk in hospital room?: Total Help needed climbing 3-5 steps with a railing? : Total 6 Click Score: 10    End of Session Equipment Utilized During Treatment: Gait belt Activity Tolerance: Patient limited by fatigue Patient left: in chair;with call bell/phone within reach;with chair alarm set   PT Visit Diagnosis: Unsteadiness on feet (R26.81);Muscle weakness (generalized) (M62.81);Difficulty in walking, not elsewhere classified (R26.2)     Time: 5732-2025 PT Time Calculation (min) (ACUTE ONLY): 13 min  Charges:  $Gait Training: 8-22 mins                    Jannette Spanner PT, DPT Physical Therapist Acute Rehabilitation Services Preferred contact method: Secure Chat Weekend Pager Only: [phone] Office: [place] 09/04/2022, 11:34 AM

## 2022-09-05 DIAGNOSIS — M1712 Unilateral primary osteoarthritis, left knee: Secondary | ICD-10-CM | POA: Diagnosis not present

## 2022-09-05 MED ORDER — METHOCARBAMOL 500 MG PO TABS: 500.0000 mg | ORAL_TABLET | Freq: Four times a day (QID) | ORAL | 0 refills | Status: AC | PRN

## 2022-09-05 MED ORDER — POLYETHYLENE GLYCOL 3350 17 G PO PACK: 17.0000 g | PACK | Freq: Every day | ORAL | 0 refills | Status: AC | PRN

## 2022-09-05 MED ORDER — ONDANSETRON HCL 4 MG PO TABS: 4.0000 mg | ORAL_TABLET | Freq: Four times a day (QID) | ORAL | 0 refills | Status: AC | PRN

## 2022-09-05 MED ORDER — OXYCODONE HCL 5 MG PO TABS: 5.0000 mg | ORAL_TABLET | Freq: Four times a day (QID) | ORAL | 0 refills | Status: AC | PRN

## 2022-09-05 NOTE — TOC Progression Note (Signed)
Transition of Care [facility]) - Progression Note    Patient Details  Name: Nathan Moran MRN: [id number] Date of Birth: Oct 28, 1946  Transition of Care [facility]) CM/SW Contact  Lennart Pall, LCSW Phone Number: 09/05/2022, 11:03 AM  Clinical Narrative:    Pt has accepted SNF bed at [facility] who can admit pt tomorrow.  INsurance authorization begun.     Barriers to Discharge: No Barriers Identified  Expected Discharge Plan and Services           Expected Discharge Date: 09/05/22               DME Arranged: Gilford Rile rolling DME Agency: [employer]                   Social Determinants of Health (SDOH) Interventions    Readmission Risk Interventions     No data to display

## 2022-09-05 NOTE — Plan of Care (Signed)
  Problem: Education: Goal: Knowledge of the prescribed therapeutic regimen will improve Outcome: Progressing   Problem: Activity: Goal: Ability to avoid complications of mobility impairment will improve Outcome: Progressing Goal: Range of joint motion will improve Outcome: Progressing   Problem: Clinical Measurements: Goal: Postoperative complications will be avoided or minimized Outcome: Progressing   Problem: Pain Management: Goal: Pain level will decrease with appropriate interventions Outcome: Progressing   Problem: Skin Integrity: Goal: Will show signs of wound healing Outcome: Progressing   Problem: Education: Goal: Knowledge of General Education information will improve Description: Including pain rating scale, medication(s)/side effects and non-pharmacologic comfort measures Outcome: Progressing   Problem: Health Behavior/Discharge Planning: Goal: Ability to manage health-related needs will improve Outcome: Progressing   Problem: Clinical Measurements: Goal: Ability to maintain clinical measurements within normal limits will improve Outcome: Progressing Goal: Will remain free from infection Outcome: Progressing Goal: Diagnostic test results will improve Outcome: Progressing Goal: Respiratory complications will improve Outcome: Progressing Goal: Cardiovascular complication will be avoided Outcome: Progressing   Problem: Activity: Goal: Risk for activity intolerance will decrease Outcome: Progressing   Problem: Coping: Goal: Level of anxiety will decrease Outcome: Progressing   Problem: Elimination: Goal: Will not experience complications related to bowel motility Outcome: Progressing Goal: Will not experience complications related to urinary retention Outcome: Progressing   Problem: Pain Managment: Goal: General experience of comfort will improve Outcome: Progressing   Problem: Safety: Goal: Ability to remain free from injury will  improve Outcome: Progressing   Problem: Skin Integrity: Goal: Risk for impaired skin integrity will decrease Outcome: Progressing

## 2022-09-05 NOTE — Discharge Summary (Cosign Needed Addendum)
Physician Discharge Summary   Patient ID: Nathan Moran MRN: [id number] DOB/AGE: Jan 17, 1946 76 y.o.  Admit date: 09/02/2022 Discharge date: 09/06/2022  Primary Diagnosis: Osteoarthritis, left knee   Admission Diagnoses:  Past Medical History:  Diagnosis Date   Anxiety    Arthritis    Bronchitis    Depression    Dyspnea    GERD (gastroesophageal reflux disease)    History of kidney stones    Hypertension    Peripheral vascular disease (HCC)    Pneumonia    Pre-diabetes    Sleep apnea    uses CPAP   Discharge Diagnoses:   Principal Problem:   OA (osteoarthritis) of knee Active Problems:   Osteoarthritis of left knee  Estimated body mass index is 36.62 kg/m as calculated from the following:   Height as of this encounter: 5\' 9"  (1.753 m).   Weight as of this encounter: 112.5 kg.  Procedure:  Procedure(s) (LRB): TOTAL KNEE ARTHROPLASTY (Left)   Consults: None  HPI: Nathan Moran is a 76 y.o. year old male with end stage OA of his left knee with progressively worsening pain and dysfunction. He has constant pain, with activity and at rest and significant functional deficits with difficulties even with ADLs. He has had extensive non-op management including analgesics, injections of cortisone and viscosupplements, and home exercise program, but remains in significant pain with significant dysfunction. Radiographs show bone on bone arthritis medial and patellofemoral. He presents now for left Total Knee Arthroplasty.     Laboratory Data: Admission on 09/02/2022  Component Date Value Ref Range Status   WBC 09/03/2022 13.2 (H)  4.0 - 10.5 K/uL Final   RBC 09/03/2022 4.15 (L)  4.22 - 5.81 MIL/uL Final   Hemoglobin 09/03/2022 12.9 (L)  13.0 - 17.0 g/dL Final   HCT 09/03/2022 39.6  39.0 - 52.0 % Final   MCV 09/03/2022 95.4  80.0 - 100.0 fL Final   MCH 09/03/2022 31.1  26.0 - 34.0 pg Final   MCHC 09/03/2022 32.6  30.0 - 36.0 g/dL Final   RDW 09/03/2022 12.8  11.5 -  15.5 % Final   Platelets 09/03/2022 218  150 - 400 K/uL Final   nRBC 09/03/2022 0.0  0.0 - 0.2 % Final   Performed at [facility], [place] [address], [place], [place] [address]   Sodium 09/03/2022 136  135 - 145 mmol/L Final   Potassium 09/03/2022 4.5  3.5 - 5.1 mmol/L Final   Chloride 09/03/2022 106  98 - 111 mmol/L Final   CO2 09/03/2022 24  22 - 32 mmol/L Final   Glucose, Bld 09/03/2022 171 (H)  70 - 99 mg/dL Final   Glucose reference range applies only to samples taken after fasting for at least 8 hours.   BUN 09/03/2022 21  8 - 23 mg/dL Final   Creatinine, Ser 09/03/2022 0.99  0.61 - 1.24 mg/dL Final   Calcium 09/03/2022 8.5 (L)  8.9 - 10.3 mg/dL Final   GFR, Estimated 09/03/2022 >60  >60 mL/min Final   Comment: (NOTE) Calculated using the CKD-EPI Creatinine Equation (2021)    Anion gap 09/03/2022 6  5 - 15 Final   Performed at [facility], [place] [address] [place], [place] [address]   WBC 09/04/2022 14.3 (H)  4.0 - 10.5 K/uL Final   RBC 09/04/2022 3.72 (L)  4.22 - 5.81 MIL/uL Final   Hemoglobin 09/04/2022 11.9 (L)  13.0 - 17.0 g/dL Final   HCT 09/04/2022 34.8 (L)  39.0 -  52.0 % Final   MCV 09/04/2022 93.5  80.0 - 100.0 fL Final   MCH 09/04/2022 32.0  26.0 - 34.0 pg Final   MCHC 09/04/2022 34.2  30.0 - 36.0 g/dL Final   RDW 09/04/2022 13.0  11.5 - 15.5 % Final   Platelets 09/04/2022 189  150 - 400 K/uL Final   nRBC 09/04/2022 0.0  0.0 - 0.2 % Final   Performed at [facility], [place] [address] [place], [place] [address]  Hospital Outpatient Visit on 08/20/2022  Component Date Value Ref Range Status   MRSA, PCR 08/20/2022 NEGATIVE  NEGATIVE Final   Staphylococcus aureus 08/20/2022 NEGATIVE  NEGATIVE Final   Comment: (NOTE) The Xpert SA Assay (FDA approved for NASAL specimens in patients 38 years of age and older), is one component of a comprehensive surveillance program. It is not intended to diagnose infection nor  to guide or monitor treatment. Performed at [facility], [place] [address], [place], [place] [address]    Hgb A1c MFr Bld 08/20/2022 6.0 (H)  4.8 - 5.6 % Final   Comment: (NOTE) Pre diabetes:          5.7%-6.4%  Diabetes:              >6.4%  Glycemic control for   <7.0% adults with diabetes    Mean Plasma Glucose 08/20/2022 125.5  mg/dL Final   Performed at [facility], [place] [address], [place], [place] [address]   Glucose-Capillary 08/20/2022 127 (H)  70 - 99 mg/dL Final   Glucose reference range applies only to samples taken after fasting for at least 8 hours.     X-Rays:No results found.  EKG: Orders placed or performed during the hospital encounter of 01/10/20   EKG 12 lead   EKG 12 lead     Hospital Course: Nathan Moran is a 76 y.o. who was admitted to [facility]. They were brought to the operating room on 09/02/2022 and underwent Procedure(s): TOTAL KNEE ARTHROPLASTY.  Patient tolerated the procedure well and was later transferred to the recovery room and then to the orthopaedic floor for postoperative care. They were given PO and IV analgesics for pain control following their surgery. They were given 24 hours of postoperative antibiotics of  Anti-infectives (From admission, onward)    Start     Dose/Rate Route Frequency Ordered Stop   09/02/22 2200  nitrofurantoin (MACRODANTIN) capsule 100 mg        100 mg Oral Daily at bedtime 09/02/22 1356     09/02/22 1630  ceFAZolin (ANCEF) IVPB 2g/100 mL premix  Status:  Discontinued        2 g 200 mL/hr over 30 Minutes Intravenous Every 6 hours 09/02/22 1356 09/02/22 1747   09/02/22 0815  ceFAZolin (ANCEF) IVPB 2g/100 mL premix        2 g 200 mL/hr over 30 Minutes Intravenous On call to O.R. 09/02/22 2751 09/02/22 1038      and started on DVT prophylaxis in the form of Xarelto.   PT and OT were ordered for total joint protocol. Discharge planning consulted to help with postop disposition  and equipment needs. Patient had a decent night on the evening of surgery. They started to get up OOB with therapy on POD #0. Hospitalization was uneventful, but prolonged due to mobility progression. It was determined that he was not safe to discharge to home due to deconditioning and SNF search was initiated. He was discharge to  SNF in stable condition.  Diet: Cardiac diet Activity: WBAT Follow-up: in 2 weeks Disposition: Skilled nursing facility Discharged Condition: stable   Discharge Instructions     Call MD / Call 911   Complete by: As directed    If you experience chest pain or shortness of breath, CALL 911 and be transported to the hospital emergency room.  If you develope a fever above 101 F, pus (white drainage) or increased drainage or redness at the wound, or calf pain, call your surgeon's office.   Change dressing   Complete by: As directed    You may remove the bulky bandage (ACE wrap and gauze) two days after surgery. You will have an adhesive waterproof bandage underneath. Leave this in place until your first follow-up appointment.   Constipation Prevention   Complete by: As directed    Drink plenty of fluids.  Prune juice may be helpful.  You may use a stool softener, such as Colace (over the counter) 100 mg twice a day.  Use MiraLax (over the counter) for constipation as needed.   Diet - low sodium heart healthy   Complete by: As directed    Do not put a pillow under the knee. Place it under the heel.   Complete by: As directed    Driving restrictions   Complete by: As directed    No driving for two weeks   Post-operative opioid taper instructions:   Complete by: As directed    POST-OPERATIVE OPIOID TAPER INSTRUCTIONS: It is important to wean off of your opioid medication as soon as possible. If you do not need pain medication after your surgery it is ok to stop day one. Opioids include: Codeine, Hydrocodone(Norco, Vicodin), Oxycodone(Percocet, oxycontin) and  hydromorphone amongst others.  Long term and even short term use of opiods can cause: Increased pain response Dependence Constipation Depression Respiratory depression And more.  Withdrawal symptoms can include Flu like symptoms Nausea, vomiting And more Techniques to manage these symptoms Hydrate well Eat regular healthy meals Stay active Use relaxation techniques(deep breathing, meditating, yoga) Do Not substitute Alcohol to help with tapering If you have been on opioids for less than two weeks and do not have pain than it is ok to stop all together.  Plan to wean off of opioids This plan should start within one week post op of your joint replacement. Maintain the same interval or time between taking each dose and first decrease the dose.  Cut the total daily intake of opioids by one tablet each day Next start to increase the time between doses. The last dose that should be eliminated is the evening dose.      TED hose   Complete by: As directed    Use stockings (TED hose) for three weeks on both leg(s).  You may remove them at night for sleeping.   Weight bearing as tolerated   Complete by: As directed       Allergies as of 09/05/2022       Reactions   Codeine    Pt experienced numbness after taking codeine        Medication List     TAKE these medications    ascorbic acid 1000 MG tablet Commonly known as: VITAMIN C Take 1,000 mg by mouth daily.   b complex vitamins tablet Take 1 tablet by mouth daily.   buPROPion 300 MG 24 hr tablet Commonly known as: WELLBUTRIN XL Take 300 mg by mouth daily.   colchicine 0.6 MG  tablet Take 0.6 mg by mouth daily. For ulcers in mouth   Cranberry 500 MG Caps Take 1,000 mg by mouth at bedtime.   Fish Oil 1000 MG Caps Take 1,000 mg by mouth daily.   furosemide 20 MG tablet Commonly known as: LASIX Take 20 mg by mouth daily.   gabapentin 300 MG capsule Commonly known as: NEURONTIN Take 300 mg by mouth 2 (two)  times daily.   losartan 50 MG tablet Commonly known as: COZAAR Take 50 mg by mouth daily.   magnesium oxide 400 (240 Mg) MG tablet Commonly known as: MAG-OX Take 400 mg by mouth at bedtime.   methocarbamol 500 MG tablet Commonly known as: ROBAXIN Take 1 tablet (500 mg total) by mouth every 6 (six) hours as needed for muscle spasms.   nitrofurantoin 100 MG capsule Commonly known as: MACRODANTIN Take 100 mg by mouth at bedtime.   omeprazole 20 MG tablet Commonly known as: PRILOSEC OTC Take 20 mg by mouth at bedtime.   ondansetron 4 MG tablet Commonly known as: ZOFRAN Take 1 tablet (4 mg total) by mouth every 6 (six) hours as needed for nausea.   oxyCODONE 5 MG immediate release tablet Commonly known as: Oxy IR/ROXICODONE Take 1-2 tablets (5-10 mg total) by mouth every 6 (six) hours as needed for moderate pain or severe pain.   polyethylene glycol 17 g packet Commonly known as: MIRALAX / GLYCOLAX Take 17 g by mouth daily as needed for mild constipation.   PRESCRIPTION MEDICATION 0.05-0.5 mLs by Intracavernosal route as needed (ED). TRIMIX Injection   PREVAGEN PO Take 1 tablet by mouth daily.   PROBIOTIC DAILY PO Take 1 capsule by mouth at bedtime.   rivaroxaban 20 MG Tabs tablet Commonly known as: XARELTO Take 20 mg by mouth daily with supper.   senna 8.6 MG Tabs tablet Commonly known as: SENOKOT Take 2 tablets by mouth at bedtime.   tamsulosin 0.4 MG Caps capsule Commonly known as: FLOMAX Take 0.4 mg by mouth at bedtime.   temazepam 15 MG capsule Commonly known as: RESTORIL Take 15 mg by mouth at bedtime.   Turmeric Curcumin 500 MG Caps Take 500 mg by mouth at bedtime.   Vitamin D 50 MCG (2000 UT) Caps Take 2,000 Units by mouth daily.               Discharge Care Instructions  (From admission, onward)           Start     Ordered   09/05/22 0000  Weight bearing as tolerated        09/05/22 0742   09/05/22 0000  Change dressing        Comments: You may remove the bulky bandage (ACE wrap and gauze) two days after surgery. You will have an adhesive waterproof bandage underneath. Leave this in place until your first follow-up appointment.   09/05/22 [id number]            Follow-up Information     Gaynelle Arabian, MD. Schedule an appointment as soon as possible for a visit in 2 week(s).   Specialty: Orthopedic Surgery Contact information: [address] [place] [address] [phone]                 Signed: Theresa Duty, PA-C Orthopedic Surgery 09/06/2022, 7:42 AM

## 2022-09-05 NOTE — Progress Notes (Deleted)
Pt is ready for discharge.  PIV removed.  Pt remains alert and oriented.  AVS given.  Pt able to verbalize understanding of all discharge instructions, including the importance of following up with PCP for repeat blood work within a week.  All questions answered.  Pt has called care giver and will discharge once his ride arrives downstairs.

## 2022-09-05 NOTE — Progress Notes (Signed)
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    PT Comments    Pt assisted with OOB and transferred to recliner.  Pt then assisted with performing sit to stands for strengthening and technique.  Pt anticipates d/c to SNF tomorrow.    Recommendations for follow up therapy are one component of a multi-disciplinary discharge planning process, led by the attending physician.  Recommendations may be updated based on patient status, additional functional criteria and insurance authorization.  Follow Up Recommendations  Skilled nursing-short term rehab (<3 hours/day) Can patient physically be transported by private vehicle: No   Assistance Recommended at Discharge Frequent or constant Supervision/Assistance  Patient can return home with the following A lot of help with bathing/dressing/bathroom;Assist for transportation;Help with stairs or ramp for entrance;Two people to help with walking and/or transfers   Equipment Recommendations  None recommended by PT    Recommendations for Other Services       Precautions / Restrictions Precautions Precautions: Knee Required Braces or Orthoses: Knee Immobilizer - Right Knee Immobilizer - Left: Discontinue once straight leg raise with < 10 degree lag;On when out of bed or walking Restrictions Other Position/Activity Restrictions: WBAT     Mobility  Bed Mobility Overal bed mobility: Needs Assistance Bed Mobility: Supine to Sit     Supine to sit: Min guard, HOB elevated     General bed mobility comments: verbal cues for self assist, increased time and effort, reliant on rails and upper body assist    Transfers Overall transfer level: Needs assistance Equipment used: Rolling walker (2  wheels) Transfers: Sit to/from Stand, Bed to chair/wheelchair/BSC Sit to Stand: Mod assist, From elevated surface Stand pivot transfers: Mod assist         General transfer comment: verbal cues for technique, positioning, weight shifting; assist for weakness and steadying; performed x3 for technique and strengthening; pt only able to hold standing position for approx 45 seconds and then fatigued    Ambulation/Gait                   Stairs             Wheelchair Mobility    Modified Rankin (Stroke Patients Only)       Balance                                            Cognition Arousal/Alertness: Awake/alert Behavior During Therapy: WFL for tasks assessed/performed Overall Cognitive Status: Within Functional Limits for tasks assessed                                          Exercises      General Comments        Pertinent Vitals/Pain Pain Assessment Pain Assessment: 0-10 Pain Score: 5  Pain Location: L knee Pain Descriptors / Indicators: Aching, Sore Pain Intervention(s): Repositioned, Monitored during session    Home Living                          Prior Function  PT Goals (current goals can now be found in the care plan section) Progress towards PT goals: Progressing toward goals    Frequency    7X/week      PT Plan Current plan remains appropriate    Co-evaluation              AM-PAC PT "6 Clicks" Mobility   Outcome Measure  Help needed turning from your back to your side while in a flat bed without using bedrails?: A Little Help needed moving from lying on your back to sitting on the side of a flat bed without using bedrails?: A Little Help needed moving to and from a bed to a chair (including a wheelchair)?: A Lot Help needed standing up from a chair using your arms (e.g., wheelchair or bedside chair)?: A Lot Help needed to walk in hospital room?: Total Help  needed climbing 3-5 steps with a railing? : Total 6 Click Score: 12    End of Session Equipment Utilized During Treatment: Gait belt Activity Tolerance: Patient limited by fatigue;Patient tolerated treatment well Patient left: in chair;with call bell/phone within reach;with chair alarm set   PT Visit Diagnosis: Unsteadiness on feet (R26.81);Muscle weakness (generalized) (M62.81)     Time: 1505-6979 PT Time Calculation (min) (ACUTE ONLY): 16 min  Charges:  $Therapeutic Activity: 8-22 mins                     Thomasene Mohair PT, DPT Physical Therapist Acute Rehabilitation Services Preferred contact method: Secure Chat Weekend Pager Only: [phone] Office: [phone]    Kati L Payson 09/05/2022, 1:05 PM

## 2022-09-06 DIAGNOSIS — M1712 Unilateral primary osteoarthritis, left knee: Secondary | ICD-10-CM | POA: Diagnosis not present

## 2022-09-06 NOTE — Progress Notes (Signed)
Called and report given to nurse Clydene Laming.

## 2022-09-06 NOTE — TOC Transition Note (Signed)
Transition of Care [facility]) - CM/SW Discharge Note   Patient Details  Name: Rossi Burdo MRN: [id number] Date of Birth: [birth year]  [facility]) CM/SW Contact:  Lennart Pall, LCSW Phone Number: 09/06/2022, 10:16 AM   Clinical Narrative:    Have received insurance authorization and medical clearance for pt to dc today to [place].  Pt and wife aware and agreeable.  PTAR called at 10:15am.  RN to call report to [phone].  No further TOC needs.   Final next level of care: Skilled Nursing Facility Barriers to Discharge: Barriers Resolved   Patient Goals and CMS Choice Patient states their goals for this hospitalization and ongoing recovery are:: return home      Discharge Placement   Existing PASRR number confirmed : 09/04/22          Patient chooses bed at: Other - please specify in the comment section below: ([facility]) Patient to be transferred to facility by: [place] Name of family member notified: wife Patient and family notified of of transfer: 09/06/22  Discharge Plan and Services                DME Arranged: N/A DME Agency: NA                  Social Determinants of Health ([place]) Interventions     Readmission Risk Interventions     No data to display

## 2022-09-06 NOTE — Plan of Care (Signed)
  Problem: Pain Management: Goal: Pain level will decrease with appropriate interventions Outcome: Progressing   Problem: Activity: Goal: Risk for activity intolerance will decrease Outcome: Progressing   

## 2022-09-06 NOTE — Progress Notes (Signed)
   Subjective: 4 Days Post-Op Procedure(s) (LRB): TOTAL KNEE ARTHROPLASTY (Left) Patient seen in rounds for Dr. Wynelle Link. Patient is well, and has had no acute complaints or problems. Denies SOB or chest pain. Denies calf pain. Patient reports pain as mild.   Plan is to go Skilled nursing facility after hospital stay.  Objective: Vital signs in last 24 hours: Temp:  [97.7 F (36.5 C)-98.2 F (36.8 C)] 97.7 F (36.5 C) (10/20 0516) Pulse Rate:  [79-86] 86 (10/20 0516) Resp:  [18] 18 (10/20 0516) BP: (132-141)/(58-75) 138/73 (10/20 0516) SpO2:  [94 %-97 %] 97 % (10/20 0516)  Intake/Output from previous day:  Intake/Output Summary (Last 24 hours) at 09/06/2022 0751 Last data filed at 09/06/2022 0600 Gross per 24 hour  Intake 1920 ml  Output 950 ml  Net 970 ml    Intake/Output this shift: No intake/output data recorded.  Labs: Recent Labs    09/04/22 0342  HGB 11.9*   Recent Labs    09/04/22 0342  WBC 14.3*  RBC 3.72*  HCT 34.8*  PLT 189   No results for input(s): "NA", "K", "CL", "CO2", "BUN", "CREATININE", "GLUCOSE", "CALCIUM" in the last 72 hours. No results for input(s): "LABPT", "INR" in the last 72 hours.  Exam: General - Patient is Alert and Oriented Extremity - Neurologically intact Neurovascular intact Sensation intact distally Dorsiflexion/Plantar flexion intact Dressing/Incision - clean, dry, no drainage Motor Function - intact, moving foot and toes well on exam.  Past Medical History:  Diagnosis Date   Anxiety    Arthritis    Bronchitis    Depression    Dyspnea    GERD (gastroesophageal reflux disease)    History of kidney stones    Hypertension    Peripheral vascular disease (HCC)    Pneumonia    Pre-diabetes    Sleep apnea    uses CPAP    Assessment/Plan: 4 Days Post-Op Procedure(s) (LRB): TOTAL KNEE ARTHROPLASTY (Left) Principal Problem:   OA (osteoarthritis) of knee Active Problems:   Osteoarthritis of left knee  Estimated  body mass index is 36.62 kg/m as calculated from the following:   Height as of this encounter: 5\' 9"  (1.753 m).   Weight as of this encounter: 112.5 kg.  DVT Prophylaxis - Xarelto Weight-bearing as tolerated.  Plan to discharge to SNF today. Follow-up in clinic in 2 weeks.  [name], PA-C Orthopedic Surgery [phone] 09/06/2022, 7:51 AM

## 2022-11-06 ENCOUNTER — Other Ambulatory Visit (HOSPITAL_COMMUNITY): Payer: Self-pay | Admitting: Orthopedic Surgery

## 2022-11-06 ENCOUNTER — Ambulatory Visit (HOSPITAL_COMMUNITY)
Admission: RE | Admit: 2022-11-06 | Discharge: 2022-11-06 | Disposition: A | Discharge status: Home / Self Care | Payer: Medicare Other | Source: Ambulatory Visit | Attending: Orthopedic Surgery | Admitting: Orthopedic Surgery

## 2022-11-06 DIAGNOSIS — M79605 Pain in left leg: Secondary | ICD-10-CM | POA: Diagnosis present

## 2022-11-06 NOTE — Progress Notes (Signed)
Lower extremity venous duplex has been completed.   Preliminary results in CV Proc.   Aundra Millet Cayde Held 11/06/2022 9:42 AM
